# Patient Record
Sex: Female | Born: 1955
Health system: Southern US, Community
[De-identification: ages and names within clinical notes are randomized; demographics above are authoritative.]

## PROBLEM LIST (undated history)

## (undated) DIAGNOSIS — T7840XA Allergy, unspecified, initial encounter: Secondary | ICD-10-CM

## (undated) DIAGNOSIS — M8580 Other specified disorders of bone density and structure, unspecified site: Secondary | ICD-10-CM

## (undated) HISTORY — DX: Allergy, unspecified, initial encounter: T78.40XA

## (undated) HISTORY — PX: COLONOSCOPY: SHX174

## (undated) HISTORY — DX: Other specified disorders of bone density and structure, unspecified site: M85.80

## (undated) HISTORY — PX: NO PAST SURGERIES: SHX2092

---

## 2000-03-19 ENCOUNTER — Encounter: Payer: Self-pay | Admitting: Family Medicine

## 2000-03-19 ENCOUNTER — Encounter: Admission: RE | Admit: 2000-03-19 | Discharge: 2000-03-19 | Payer: Self-pay | Admitting: Family Medicine

## 2002-08-29 ENCOUNTER — Encounter: Payer: Self-pay | Admitting: Family Medicine

## 2002-08-29 ENCOUNTER — Encounter: Admission: RE | Admit: 2002-08-29 | Discharge: 2002-08-29 | Payer: Self-pay | Admitting: Family Medicine

## 2004-05-19 ENCOUNTER — Encounter: Admission: RE | Admit: 2004-05-19 | Discharge: 2004-05-19 | Payer: Self-pay | Admitting: Internal Medicine

## 2009-04-19 ENCOUNTER — Emergency Department (HOSPITAL_COMMUNITY): Admission: EM | Admit: 2009-04-19 | Discharge: 2009-04-19 | Payer: Self-pay | Admitting: Emergency Medicine

## 2010-10-04 ENCOUNTER — Encounter: Payer: Self-pay | Admitting: Internal Medicine

## 2010-11-08 ENCOUNTER — Ambulatory Visit (AMBULATORY_SURGERY_CENTER): Payer: 59 | Admitting: *Deleted

## 2010-11-08 VITALS — Ht 63.0 in | Wt 133.0 lb

## 2010-11-08 DIAGNOSIS — Z1211 Encounter for screening for malignant neoplasm of colon: Secondary | ICD-10-CM

## 2010-11-08 MED ORDER — PEG-KCL-NACL-NASULF-NA ASC-C 100 G PO SOLR: ORAL | Status: DC

## 2010-11-21 ENCOUNTER — Telehealth: Payer: Self-pay | Admitting: Internal Medicine

## 2010-11-21 NOTE — Telephone Encounter (Signed)
Returned pts phone call.  She wanted to know if she could start her prep at 7 after she gets home from work.  Advised her that it would be fine and that she should just push everything back 2 hours.  Pt verbalized understanding.

## 2010-11-22 ENCOUNTER — Ambulatory Visit (AMBULATORY_SURGERY_CENTER): Payer: 59 | Admitting: Internal Medicine

## 2010-11-22 ENCOUNTER — Encounter: Payer: Self-pay | Admitting: Internal Medicine

## 2010-11-22 VITALS — BP 115/70 | HR 66 | Temp 97.3°F | Resp 23 | Ht 63.6 in | Wt 133.0 lb

## 2010-11-22 DIAGNOSIS — D126 Benign neoplasm of colon, unspecified: Secondary | ICD-10-CM

## 2010-11-22 DIAGNOSIS — K5730 Diverticulosis of large intestine without perforation or abscess without bleeding: Secondary | ICD-10-CM

## 2010-11-22 DIAGNOSIS — Z1211 Encounter for screening for malignant neoplasm of colon: Secondary | ICD-10-CM

## 2010-11-22 MED ORDER — SODIUM CHLORIDE 0.9 % IV SOLN: 500.0000 mL | INTRAVENOUS | Status: DC

## 2010-11-22 NOTE — Patient Instructions (Addendum)
Two tiny polyps (2-3 mm in size) were removed. These look benign. You also have diverticulosis. I will send a letter about the polyp pathology and when you should have a routine repeat colonoscopy. Iva Boop, MD, [facility] Information given on polyps, diverticulosis and high fiber diet. Resume all medications.

## 2010-11-22 NOTE — Progress Notes (Signed)
Pt was uncomfortable while the scope was being advanced.  Medication was titrated per Dr. Marvell Fuller orders.  Pt did relax once the cecum was reached and rested comfortably on the way out with her eyes closed. maw

## 2010-11-23 ENCOUNTER — Telehealth: Payer: Self-pay | Admitting: *Deleted

## 2010-11-23 NOTE — Telephone Encounter (Signed)
Follow up Call- Patient questions:Pt was at work, spoke with her husband  Do you have a fever, pain , or abdominal swelling? no Pain Score  0 *  Have you tolerated food without any problems? yes  Have you been able to return to your normal activities? yes  Do you have any questions about your discharge instructions: Diet   no Medications  no Follow up visit  no  Do you have questions or concerns about your Care? no  Actions: * If pain score is 4 or above: No action needed, pain <4.

## 2010-11-29 ENCOUNTER — Encounter: Payer: Self-pay | Admitting: Internal Medicine

## 2010-11-29 NOTE — Progress Notes (Signed)
Quick Note:  Repeat colonoscopy 11/2020 - no adenomas ______

## 2012-04-06 ENCOUNTER — Other Ambulatory Visit: Payer: Self-pay

## 2012-09-24 ENCOUNTER — Other Ambulatory Visit (HOSPITAL_COMMUNITY): Payer: Self-pay | Admitting: Internal Medicine

## 2012-09-24 DIAGNOSIS — Z139 Encounter for screening, unspecified: Secondary | ICD-10-CM

## 2012-09-26 ENCOUNTER — Ambulatory Visit (HOSPITAL_COMMUNITY)
Admission: RE | Admit: 2012-09-26 | Discharge: 2012-09-26 | Disposition: A | Discharge status: Home / Self Care | Payer: 59 | Source: Ambulatory Visit | Attending: Internal Medicine | Admitting: Internal Medicine

## 2012-09-26 DIAGNOSIS — Z1231 Encounter for screening mammogram for malignant neoplasm of breast: Secondary | ICD-10-CM | POA: Insufficient documentation

## 2012-09-26 DIAGNOSIS — Z139 Encounter for screening, unspecified: Secondary | ICD-10-CM

## 2012-10-07 ENCOUNTER — Other Ambulatory Visit: Payer: Self-pay | Admitting: Internal Medicine

## 2012-10-07 DIAGNOSIS — R928 Other abnormal and inconclusive findings on diagnostic imaging of breast: Secondary | ICD-10-CM

## 2012-10-23 ENCOUNTER — Ambulatory Visit (HOSPITAL_COMMUNITY)
Admission: RE | Admit: 2012-10-23 | Discharge: 2012-10-23 | Disposition: A | Discharge status: Home / Self Care | Payer: 59 | Source: Ambulatory Visit | Attending: Internal Medicine | Admitting: Internal Medicine

## 2012-10-23 DIAGNOSIS — R928 Other abnormal and inconclusive findings on diagnostic imaging of breast: Secondary | ICD-10-CM | POA: Insufficient documentation

## 2012-12-26 ENCOUNTER — Other Ambulatory Visit: Payer: Self-pay

## 2013-02-21 ENCOUNTER — Encounter (HOSPITAL_COMMUNITY): Payer: Self-pay | Admitting: Emergency Medicine

## 2013-02-21 ENCOUNTER — Emergency Department (HOSPITAL_COMMUNITY)
Admission: EM | Admit: 2013-02-21 | Discharge: 2013-02-21 | Disposition: A | Discharge status: Home / Self Care | Payer: 59 | Attending: Emergency Medicine | Admitting: Emergency Medicine

## 2013-02-21 DIAGNOSIS — Z791 Long term (current) use of non-steroidal anti-inflammatories (NSAID): Secondary | ICD-10-CM | POA: Insufficient documentation

## 2013-02-21 DIAGNOSIS — J3489 Other specified disorders of nose and nasal sinuses: Secondary | ICD-10-CM | POA: Insufficient documentation

## 2013-02-21 DIAGNOSIS — J029 Acute pharyngitis, unspecified: Secondary | ICD-10-CM | POA: Insufficient documentation

## 2013-02-21 DIAGNOSIS — R509 Fever, unspecified: Secondary | ICD-10-CM | POA: Insufficient documentation

## 2013-02-21 LAB — RAPID STREP SCREEN (MED CTR MEBANE ONLY): STREPTOCOCCUS, GROUP A SCREEN (DIRECT): NEGATIVE

## 2013-02-21 MED ORDER — NAPROXEN 500 MG PO TABS: 500.0000 mg | ORAL_TABLET | Freq: Two times a day (BID) | ORAL | Status: DC

## 2013-02-21 NOTE — ED Provider Notes (Signed)
CSN: [id number]     Arrival date & time 02/21/13  7793 History   First MD Initiated Contact with Patient 02/21/13 [id number]     Chief Complaint  Patient presents with  . Sore Throat   (Consider location/radiation/quality/duration/timing/severity/associated sxs/prior Treatment) HPI Comments: 58 year old female, history of no significant past medical problems who presents with a sore throat that has been ongoing for several days, reports intermittent low-grade fever and nasal congestion. The throat is persistent, gradually getting worse, not associated with the change in voice, there is no significant cough, no abdominal pain, no nausea or vomiting. She did have dental work done in the last week but denies swelling in her mouth or dental pain  Patient is a 58 y.o. female presenting with pharyngitis. The history is provided by the patient.  Sore Throat    History reviewed. No pertinent past medical history. Past Surgical History  Procedure Laterality Date  . Colonoscopy  11/22/2010    diverticulosis (no adenomas)   No family history on file. History  Substance Use Topics  . Smoking status: Never Smoker   . Smokeless tobacco: Never Used  . Alcohol Use: No   OB History   Grav Para Term Preterm Abortions TAB SAB Ect Mult Living                 Review of Systems  All other systems reviewed and are negative.    Allergies  Review of patient's allergies indicates no known allergies.  Home Medications   Current Outpatient Rx  Name  Route  Sig  Dispense  Refill  . naproxen (NAPROSYN) 500 MG tablet   Oral   Take 1 tablet (500 mg total) by mouth 2 (two) times daily with a meal.   30 tablet   0    BP 131/87  Pulse 70  Resp 18  Ht 5\' 3"  (1.6 m)  Wt 128 lb (58.06 kg)  BMI 22.68 kg/m2  SpO2 99% Physical Exam  Nursing note and vitals reviewed. Constitutional: She appears well-developed and well-nourished. No distress.  HENT:  Head: Normocephalic and atraumatic.  Mouth/Throat:  Oropharynx is clear and moist. No oropharyngeal exudate.  Oropharynx is clear and moist, normal appearing tonsils with no exudate asymmetry hypertrophy or erythema, normal mucous membranes are moist, tympanic membranes are clear, nasal passages are clear with mild rhinorrhea  Eyes: Conjunctivae and EOM are normal. Pupils are equal, round, and reactive to light. Right eye exhibits no discharge. Left eye exhibits no discharge. No scleral icterus.  Neck: Normal range of motion. Neck supple. No JVD present. No thyromegaly present.  Cardiovascular: Normal rate, regular rhythm, normal heart sounds and intact distal pulses.  Exam reveals no gallop and no friction rub.   No murmur heard. Pulmonary/Chest: Effort normal and breath sounds normal. No respiratory distress. She has no wheezes. She has no rales.  Abdominal: Soft. Bowel sounds are normal. She exhibits no distension and no mass. There is no tenderness.  No hepatosplenomegaly  Lymphadenopathy:    She has cervical adenopathy ( Shotty tender left anterior cervical chain lymphadenopathy).  Neurological: She is alert. Coordination normal.  Skin: Skin is warm and dry. No rash noted. No erythema.  Psychiatric: She has a normal mood and affect. Her behavior is normal.    ED Course  Procedures (including critical care time) Labs Review Labs Reviewed  RAPID STREP SCREEN  CULTURE, GROUP A STREP   Imaging Review No results found.  EKG Interpretation   None  MDM   1. Pharyngitis    The patient has a pharyngitis, she does not have a fever, she does not have tachycardia or a hypoxia, evaluate for strep no signs of dental complications.  Strep neg - stable for d/c.  Meds given in ED:  Medications - No data to display  New Prescriptions   NAPROXEN (NAPROSYN) 500 MG TABLET    Take 1 tablet (500 mg total) by mouth 2 (two) times daily with a meal.      Johnna Acosta, MD 02/21/13 [phone]

## 2013-02-21 NOTE — Discharge Instructions (Signed)
Strep test negative,  Naprosyn for pain,  Tylenol for fever,  You likely have a sore throat from a viral infection - it is not strep - return as needed.  See attached instructions.  Please call your doctor for a followup appointment within 24-48 hours. When you talk to your doctor please let them know that you were seen in the emergency department and have them acquire all of your records so that they can discuss the findings with you and formulate a treatment plan to fully care for your new and ongoing problems.

## 2013-02-21 NOTE — ED Notes (Signed)
Pt c/o sore throat since Monday and intermittent low grade fever.  Reports sore throat is progressively getting worse.

## 2013-02-23 LAB — CULTURE, GROUP A STREP

## 2013-03-11 ENCOUNTER — Ambulatory Visit (INDEPENDENT_AMBULATORY_CARE_PROVIDER_SITE_OTHER): Payer: 59 | Admitting: Nurse Practitioner

## 2013-03-11 ENCOUNTER — Encounter: Payer: Self-pay | Admitting: Nurse Practitioner

## 2013-03-11 VITALS — BP 102/60 | HR 64 | Ht 63.0 in | Wt 127.0 lb

## 2013-03-11 DIAGNOSIS — Z01419 Encounter for gynecological examination (general) (routine) without abnormal findings: Secondary | ICD-10-CM

## 2013-03-11 DIAGNOSIS — Z0000 Encounter for general adult medical examination without abnormal findings: Secondary | ICD-10-CM

## 2013-03-11 MED ORDER — MEDROXYPROGESTERONE ACETATE 10 MG PO TABS: 10.0000 mg | ORAL_TABLET | Freq: Every day | ORAL | Status: DC

## 2013-03-11 NOTE — Patient Instructions (Signed)

## 2013-03-11 NOTE — Progress Notes (Signed)
Patient ID: Stephanie Morrow, female   DOB: 1955-06-23, 58 y.o.   MRN: [id number] 58 y.o. G0P0 Married Caucasian Fe here for NGYN annual exam.  LMP about 6 months ago. Before this last cycle she was very light and irregular for about a year. Since the last menses only one occasion of menstrual symptoms without a cycle.  Some vaso symptoms several months ago but not now.   No vaginal dryness. No change in libido. No other menopausal symptoms.  No LMP recorded. Patient is not currently having periods (Reason: Perimenopausal).          Sexually active: yes  The current method of family planning is post menopausal status.    Exercising: yes  Gym/ health club routine includes cardio and mod to heavy weightlifting.  Pt exercises 30-45 minutes 4-5 days per week at [facility] or [facility]. Smoker:  no  Health Maintenance: Pap:  2+ years, no abnormal pap's MMG:  10/23/12, Bi-Rads 3: probable benign, 6 month recall for right Colonoscopy:  11/22/10, diverticula, repeat in 5 years BMD:   2012  TDaP:  UTD ? 2008 Labs: PCP, Dr. Brigitte Pulse   reports that she has never smoked. She has never used smokeless tobacco. She reports that she does not drink alcohol or use illicit drugs.  History reviewed. No pertinent past medical history.  Past Surgical History  Procedure Laterality Date  . No past surgeries      Current Outpatient Prescriptions  Medication Sig Dispense Refill  . Multiple Vitamin (MULTIVITAMIN) tablet Take 1 tablet by mouth daily.      . Omega 3 1000 MG CAPS Take by mouth.      . medroxyPROGESTERone (PROVERA) 10 MG tablet Take 1 tablet (10 mg total) by mouth daily.  10 tablet  0   No current facility-administered medications for this visit.    Family History  Problem Relation Age of Onset  . Cancer Father   . COPD Father     ROS:  Pertinent items are noted in HPI.  Otherwise, a comprehensive ROS was negative.  Exam:   BP 102/60  Pulse 64  Ht 5\' 3"  (1.6 m)  Wt 127 lb (57.607 kg)  BMI 22.50 kg/m2  Height: 5\' 3"  (160 cm)  Ht Readings from Last 3 Encounters:  03/11/13 5\' 3"  (1.6 m)  02/21/13 5\' 3"  (1.6 m)  11/22/10 5' 3.6" (1.615 m)    General appearance: alert, cooperative and appears stated age Head: Normocephalic, without obvious abnormality, atraumatic Neck: no adenopathy, supple, symmetrical, trachea midline and thyroid normal to inspection and palpation Lungs: clear to auscultation bilaterally Breasts: normal appearance, no masses or tenderness, dense breast. Heart: regular rate and rhythm Abdomen: soft, non-tender; no masses,  no organomegaly Extremities: extremities normal, atraumatic, no cyanosis or edema Skin: Skin color, texture, turgor normal. No rashes or lesions Lymph nodes: Cervical, supraclavicular, and axillary nodes normal. No abnormal inguinal nodes palpated Neurologic: Grossly normal   Pelvic: External genitalia:  no lesions              Urethra:  normal appearing urethra with no masses, tenderness or lesions              Bartholin's and Skene's: normal                 Vagina: normal appearing vagina with normal color and discharge, no lesions              Cervix: anteverted  Pap taken: yes Bimanual Exam:  Uterus:  normal size, contour, position, consistency, mobility, non-tender              Adnexa: no mass, fullness, tenderness               Rectovaginal: Confirms               Anus:  normal sphincter tone, no lesions  A:  Well Woman with normal exam  Perimenopausal with current amenorrhea X 6 months  P:   Pap smear as per guidelines Done today  Mammogram is due first of March - recall of 6 months on the right  Will do Provera challenge and to report any withdrawal bleeding afterwards, if no bleeding to get [facility].  Counseled on breast self exam, mammography screening, adequate intake of calcium and vitamin D, diet and exercise return annually or prn  An After Visit Summary was printed and given to the patient.

## 2013-03-13 LAB — IPS PAP TEST WITH HPV

## 2013-03-14 NOTE — Progress Notes (Signed)
Encounter reviewed by Dr. Brook Silva.  

## 2013-03-21 ENCOUNTER — Other Ambulatory Visit (HOSPITAL_COMMUNITY): Payer: Self-pay | Admitting: Internal Medicine

## 2013-03-21 DIAGNOSIS — R928 Other abnormal and inconclusive findings on diagnostic imaging of breast: Secondary | ICD-10-CM

## 2013-04-04 ENCOUNTER — Telehealth: Payer: Self-pay | Admitting: Nurse Practitioner

## 2013-04-04 DIAGNOSIS — R928 Other abnormal and inconclusive findings on diagnostic imaging of breast: Secondary | ICD-10-CM

## 2013-04-04 NOTE — Telephone Encounter (Signed)
Call to patient, female states patient wanted to let us know she did not have any bleeding.Per  Note from PG at [facility], if no bleeding, needs [facility]SH.  Advised to have patient call to discuss this.

## 2013-04-04 NOTE — Telephone Encounter (Signed)
Spoke with patient. She finished two weeks of Provera with no withdrawal bleeding. Per Milford Cage, FNP to have [facility] drawn.  Ordered and released.  Patient would like to have drawn at [facility].

## 2013-04-04 NOTE — Telephone Encounter (Signed)
Pt calling to give a update on her medication.

## 2013-04-07 NOTE — Telephone Encounter (Signed)
Stephanie Morrow patient wants to have [place] drawn at [facility]. How long after she finishes progesterone do you want her to have [place] drawn?

## 2013-04-07 NOTE — Telephone Encounter (Signed)
She can have [place] drawn now - I usually give them 2 weeks after last Provera tablet to see if they bleed. Then get an [facility] at about a month out.  Looks like she is about there now. [place] for order to [employer] [facility].

## 2013-04-08 NOTE — Telephone Encounter (Signed)
Message left with Jeneen Rinks (OK per ROI) that OK to have labs drawn now and order has been faxed to [facility].  He will relay message to patient. Order faxed to [phone]. Order also in [place].

## 2013-04-09 ENCOUNTER — Other Ambulatory Visit: Payer: Self-pay | Admitting: Nurse Practitioner

## 2013-04-09 LAB — FOLLICLE STIMULATING HORMONE: FSH: 37.2 m[IU]/mL

## 2013-05-14 ENCOUNTER — Encounter (HOSPITAL_COMMUNITY): Payer: 59

## 2013-05-21 ENCOUNTER — Encounter (HOSPITAL_COMMUNITY): Payer: 59

## 2013-05-28 ENCOUNTER — Ambulatory Visit (HOSPITAL_COMMUNITY)
Admission: RE | Admit: 2013-05-28 | Discharge: 2013-05-28 | Disposition: A | Discharge status: Home / Self Care | Payer: 59 | Source: Ambulatory Visit | Attending: Internal Medicine | Admitting: Internal Medicine

## 2013-05-28 DIAGNOSIS — Z09 Encounter for follow-up examination after completed treatment for conditions other than malignant neoplasm: Secondary | ICD-10-CM | POA: Insufficient documentation

## 2013-05-28 DIAGNOSIS — R928 Other abnormal and inconclusive findings on diagnostic imaging of breast: Secondary | ICD-10-CM | POA: Insufficient documentation

## 2014-03-19 ENCOUNTER — Ambulatory Visit: Payer: 59 | Admitting: Nurse Practitioner

## 2014-09-28 ENCOUNTER — Other Ambulatory Visit (HOSPITAL_COMMUNITY): Payer: Self-pay | Admitting: Internal Medicine

## 2014-09-28 DIAGNOSIS — Z09 Encounter for follow-up examination after completed treatment for conditions other than malignant neoplasm: Secondary | ICD-10-CM

## 2014-09-28 DIAGNOSIS — R921 Mammographic calcification found on diagnostic imaging of breast: Secondary | ICD-10-CM

## 2014-10-06 ENCOUNTER — Ambulatory Visit (HOSPITAL_COMMUNITY)
Admission: RE | Admit: 2014-10-06 | Discharge: 2014-10-06 | Disposition: A | Discharge status: Home / Self Care | Payer: 59 | Source: Ambulatory Visit | Attending: Internal Medicine | Admitting: Internal Medicine

## 2014-10-06 DIAGNOSIS — Z09 Encounter for follow-up examination after completed treatment for conditions other than malignant neoplasm: Secondary | ICD-10-CM

## 2014-10-06 DIAGNOSIS — R921 Mammographic calcification found on diagnostic imaging of breast: Secondary | ICD-10-CM | POA: Insufficient documentation

## 2015-05-14 DIAGNOSIS — Z0000 Encounter for general adult medical examination without abnormal findings: Secondary | ICD-10-CM | POA: Diagnosis not present

## 2015-05-21 DIAGNOSIS — Z6825 Body mass index (BMI) 25.0-25.9, adult: Secondary | ICD-10-CM | POA: Diagnosis not present

## 2015-05-21 DIAGNOSIS — Z0000 Encounter for general adult medical examination without abnormal findings: Secondary | ICD-10-CM | POA: Diagnosis not present

## 2015-05-21 DIAGNOSIS — Z1389 Encounter for screening for other disorder: Secondary | ICD-10-CM | POA: Diagnosis not present

## 2015-05-21 DIAGNOSIS — Z780 Asymptomatic menopausal state: Secondary | ICD-10-CM | POA: Diagnosis not present

## 2015-09-07 DIAGNOSIS — M810 Age-related osteoporosis without current pathological fracture: Secondary | ICD-10-CM | POA: Diagnosis not present

## 2015-09-07 DIAGNOSIS — Z6825 Body mass index (BMI) 25.0-25.9, adult: Secondary | ICD-10-CM | POA: Diagnosis not present

## 2015-09-07 MED FILL — ALENDRONATE NA 70 MG TAB: 70 | 84 days supply | Qty: 12 | Fill #0

## 2015-10-15 ENCOUNTER — Other Ambulatory Visit (HOSPITAL_COMMUNITY): Payer: Self-pay | Admitting: Internal Medicine

## 2015-10-15 DIAGNOSIS — Z1231 Encounter for screening mammogram for malignant neoplasm of breast: Secondary | ICD-10-CM

## 2015-10-20 ENCOUNTER — Ambulatory Visit (HOSPITAL_COMMUNITY)
Admission: RE | Admit: 2015-10-20 | Discharge: 2015-10-20 | Disposition: A | Discharge status: Home / Self Care | Payer: 59 | Source: Ambulatory Visit | Attending: Internal Medicine | Admitting: Internal Medicine

## 2015-10-20 DIAGNOSIS — Z1231 Encounter for screening mammogram for malignant neoplasm of breast: Secondary | ICD-10-CM | POA: Diagnosis not present

## 2015-12-16 MED FILL — ALENDRONATE NA 70 MG TAB: 70 | 84 days supply | Qty: 12 | Fill #1

## 2016-04-17 MED FILL — ALENDRONATE NA 70 MG TAB: 70 | 84 days supply | Qty: 12 | Fill #2

## 2016-09-18 ENCOUNTER — Other Ambulatory Visit (HOSPITAL_COMMUNITY): Payer: Self-pay | Admitting: Internal Medicine

## 2016-09-18 DIAGNOSIS — Z1231 Encounter for screening mammogram for malignant neoplasm of breast: Secondary | ICD-10-CM

## 2016-09-28 MED FILL — ALENDRONATE NA 70 MG TAB: 70 | 84 days supply | Qty: 12 | Fill #0

## 2016-10-17 DIAGNOSIS — H524 Presbyopia: Secondary | ICD-10-CM | POA: Diagnosis not present

## 2016-10-25 ENCOUNTER — Encounter (HOSPITAL_COMMUNITY): Payer: Self-pay | Admitting: Radiology

## 2016-10-25 ENCOUNTER — Ambulatory Visit (HOSPITAL_COMMUNITY)
Admission: RE | Admit: 2016-10-25 | Discharge: 2016-10-25 | Disposition: A | Discharge status: Home / Self Care | Payer: 59 | Source: Ambulatory Visit | Attending: Internal Medicine | Admitting: Internal Medicine

## 2016-10-25 DIAGNOSIS — Z1231 Encounter for screening mammogram for malignant neoplasm of breast: Secondary | ICD-10-CM | POA: Insufficient documentation

## 2017-01-16 DIAGNOSIS — Z0000 Encounter for general adult medical examination without abnormal findings: Secondary | ICD-10-CM | POA: Diagnosis not present

## 2017-01-16 DIAGNOSIS — M810 Age-related osteoporosis without current pathological fracture: Secondary | ICD-10-CM | POA: Diagnosis not present

## 2017-01-18 DIAGNOSIS — D1801 Hemangioma of skin and subcutaneous tissue: Secondary | ICD-10-CM | POA: Diagnosis not present

## 2017-01-18 DIAGNOSIS — L814 Other melanin hyperpigmentation: Secondary | ICD-10-CM | POA: Diagnosis not present

## 2017-01-18 DIAGNOSIS — L918 Other hypertrophic disorders of the skin: Secondary | ICD-10-CM | POA: Diagnosis not present

## 2017-01-18 DIAGNOSIS — D2271 Melanocytic nevi of right lower limb, including hip: Secondary | ICD-10-CM | POA: Diagnosis not present

## 2017-01-18 DIAGNOSIS — L821 Other seborrheic keratosis: Secondary | ICD-10-CM | POA: Diagnosis not present

## 2017-01-18 DIAGNOSIS — L720 Epidermal cyst: Secondary | ICD-10-CM | POA: Diagnosis not present

## 2017-01-23 DIAGNOSIS — Z0000 Encounter for general adult medical examination without abnormal findings: Secondary | ICD-10-CM | POA: Diagnosis not present

## 2017-01-23 DIAGNOSIS — Z1389 Encounter for screening for other disorder: Secondary | ICD-10-CM | POA: Diagnosis not present

## 2017-01-23 DIAGNOSIS — M810 Age-related osteoporosis without current pathological fracture: Secondary | ICD-10-CM | POA: Diagnosis not present

## 2017-01-26 DIAGNOSIS — Z1212 Encounter for screening for malignant neoplasm of rectum: Secondary | ICD-10-CM | POA: Diagnosis not present

## 2017-02-14 MED FILL — ALENDRONATE NA 70 MG TAB: 70 | 84 days supply | Qty: 12 | Fill #1

## 2017-07-11 MED FILL — ALENDRONATE NA 70 MG TAB: 70 | 84 days supply | Qty: 12 | Fill #2

## 2017-09-26 ENCOUNTER — Other Ambulatory Visit (HOSPITAL_COMMUNITY): Payer: Self-pay | Admitting: Internal Medicine

## 2017-09-26 DIAGNOSIS — Z1231 Encounter for screening mammogram for malignant neoplasm of breast: Secondary | ICD-10-CM

## 2017-11-07 ENCOUNTER — Ambulatory Visit (HOSPITAL_COMMUNITY)
Admission: RE | Admit: 2017-11-07 | Discharge: 2017-11-07 | Disposition: A | Discharge status: Home / Self Care | Payer: 59 | Source: Ambulatory Visit | Attending: Internal Medicine | Admitting: Internal Medicine

## 2017-11-07 DIAGNOSIS — Z1231 Encounter for screening mammogram for malignant neoplasm of breast: Secondary | ICD-10-CM | POA: Insufficient documentation

## 2017-11-08 MED FILL — ALENDRONATE NA 70 MG TAB: 70 | 84 days supply | Qty: 12 | Fill #0

## 2018-01-22 DIAGNOSIS — R82998 Other abnormal findings in urine: Secondary | ICD-10-CM | POA: Diagnosis not present

## 2018-01-22 DIAGNOSIS — M810 Age-related osteoporosis without current pathological fracture: Secondary | ICD-10-CM | POA: Diagnosis not present

## 2018-01-22 DIAGNOSIS — Z0000 Encounter for general adult medical examination without abnormal findings: Secondary | ICD-10-CM | POA: Diagnosis not present

## 2018-01-30 DIAGNOSIS — M545 Low back pain: Secondary | ICD-10-CM | POA: Diagnosis not present

## 2018-01-30 DIAGNOSIS — M810 Age-related osteoporosis without current pathological fracture: Secondary | ICD-10-CM | POA: Diagnosis not present

## 2018-01-30 DIAGNOSIS — Z6824 Body mass index (BMI) 24.0-24.9, adult: Secondary | ICD-10-CM | POA: Diagnosis not present

## 2018-01-30 DIAGNOSIS — Z1389 Encounter for screening for other disorder: Secondary | ICD-10-CM | POA: Diagnosis not present

## 2018-01-30 DIAGNOSIS — Z0000 Encounter for general adult medical examination without abnormal findings: Secondary | ICD-10-CM | POA: Diagnosis not present

## 2018-02-06 ENCOUNTER — Encounter: Payer: Self-pay | Admitting: Certified Nurse Midwife

## 2018-02-18 DIAGNOSIS — Z1212 Encounter for screening for malignant neoplasm of rectum: Secondary | ICD-10-CM | POA: Diagnosis not present

## 2018-02-22 ENCOUNTER — Other Ambulatory Visit: Payer: Self-pay

## 2018-02-22 ENCOUNTER — Other Ambulatory Visit (HOSPITAL_COMMUNITY)
Admission: RE | Admit: 2018-02-22 | Discharge: 2018-02-22 | Disposition: A | Discharge status: Home / Self Care | Payer: 59 | Source: Ambulatory Visit | Attending: Certified Nurse Midwife | Admitting: Certified Nurse Midwife

## 2018-02-22 ENCOUNTER — Encounter: Payer: Self-pay | Admitting: Certified Nurse Midwife

## 2018-02-22 ENCOUNTER — Ambulatory Visit (INDEPENDENT_AMBULATORY_CARE_PROVIDER_SITE_OTHER): Payer: 59 | Admitting: Certified Nurse Midwife

## 2018-02-22 VITALS — BP 104/64 | HR 68 | Resp 16 | Ht 62.25 in | Wt 133.0 lb

## 2018-02-22 DIAGNOSIS — Z01419 Encounter for gynecological examination (general) (routine) without abnormal findings: Secondary | ICD-10-CM | POA: Diagnosis not present

## 2018-02-22 DIAGNOSIS — N951 Menopausal and female climacteric states: Secondary | ICD-10-CM | POA: Diagnosis not present

## 2018-02-22 DIAGNOSIS — Z124 Encounter for screening for malignant neoplasm of cervix: Secondary | ICD-10-CM | POA: Insufficient documentation

## 2018-02-22 DIAGNOSIS — Z8739 Personal history of other diseases of the musculoskeletal system and connective tissue: Secondary | ICD-10-CM

## 2018-02-22 NOTE — Patient Instructions (Signed)

## 2018-02-22 NOTE — Progress Notes (Signed)
63 y.o. G0P0 Married  Caucasian Fe here to re- establish gyn care for annual exam. Periods stopped menses at age 73. Denies vaginal bleeding or vaginal dryness. Sees PCP Dr. Brigitte Pulse annually, labs and Osteoporosis management. Taking Fosamax with out issues. Runs for exercise. Has not had pap smear since 2015. No history of abnormal pap. Works as Engineer, maintenance (IT) at [employer]. No other health issues today.  Patient's last menstrual period was 09/20/2010.          Sexually active: Yes.    The current method of family planning is post menopausal status.    Exercising: Yes.    running Smoker:  no  Review of Systems  Constitutional: Negative.   HENT: Negative.   Eyes: Negative.   Respiratory: Negative.   Cardiovascular: Negative.   Gastrointestinal: Negative.   Genitourinary: Negative.   Musculoskeletal: Negative.   Skin: Negative.   Neurological: Negative.   Endo/Heme/Allergies: Negative.   Psychiatric/Behavioral: Negative.     Health Maintenance: Pap:  03-11-13 neg HPV HR neg History of Abnormal Pap: no MMG:  11-07-17 category d density birads 1:neg Self Breast exams: occ Colonoscopy:  2015 f/u 47yrs BMD:   12/19 osteopenia TDaP:  UTD Shingles: not done Pneumonia: not done Hep C and HIV: not done Labs: with PCP   reports that she has quit smoking. She has never used smokeless tobacco. She reports that she does not drink alcohol or use drugs.  History reviewed. No pertinent past medical history.  Past Surgical History:  Procedure Laterality Date  . NO PAST SURGERIES      Current Outpatient Medications  Medication Sig Dispense Refill  . Alendronate Sodium (FOSAMAX PO) Take by mouth.    . Multiple Vitamin (MULTIVITAMIN) tablet Take 1 tablet by mouth.     Marland Kitchen VITAMIN D PO Take 5,000 Int'l Units by mouth. 5 times a week     No current facility-administered medications for this visit.     Family History  Problem Relation Age of Onset  . Breast cancer Mother   . Hypertension  Mother   . Kidney disease Mother   . Stroke Mother        mini stroke  . COPD Father   . Lung cancer Father        smoker  . Breast cancer Maternal Aunt   . Breast cancer Maternal Aunt     ROS:  Pertinent items are noted in HPI.  Otherwise, a comprehensive ROS was negative.  Exam:   BP 104/64   Pulse 68   Resp 16   Ht 5' 2.25" (1.581 m)   Wt 133 lb (60.3 kg)   LMP 09/20/2010   BMI 24.13 kg/m  Height: 5' 2.25" (158.1 cm) Ht Readings from Last 3 Encounters:  02/22/18 5' 2.25" (1.581 m)  03/11/13 5\' 3"  (1.6 m)  02/21/13 5\' 3"  (1.6 m)    General appearance: alert, cooperative and appears stated age Head: Normocephalic, without obvious abnormality, atraumatic Neck: no adenopathy, supple, symmetrical, trachea midline and thyroid normal to inspection and palpation Lungs: clear to auscultation bilaterally Breasts: normal appearance, no masses or tenderness, No nipple retraction or dimpling, No nipple discharge or bleeding, No axillary or supraclavicular adenopathy Heart: regular rate and rhythm Abdomen: soft, non-tender; no masses,  no organomegaly Extremities: extremities normal, atraumatic, no cyanosis or edema Skin: Skin color, texture, turgor normal. No rashes or lesions Lymph nodes: Cervical, supraclavicular, and axillary nodes normal. No abnormal inguinal nodes palpated Neurologic: Grossly normal   Pelvic: External  genitalia:  no lesions              Urethra:  normal appearing urethra with no masses, tenderness or lesions              Bartholin's and Skene's: normal                 Vagina: normal appearing vagina with normal color and discharge, no lesions              Cervix: no cervical motion tenderness, no lesions and normal appearance              Pap taken: Yes.   Bimanual Exam:  Uterus:  normal size, contour, position, consistency, mobility, non-tender and anteverted              Adnexa: normal adnexa and no mass, fullness, tenderness               Rectovaginal:  Confirms               Anus:  normal sphincter tone, no lesions  Chaperone present: yes  A:  Well Woman with normal exam  Menopausal no HRT  Vaginal dryness  Osteoporosis with PCP management, BMD due, taking Fosamax  P:   Reviewed health and wellness pertinent to exam  Discussed importance of notifying if vaginal bleeding.  Discussed OTC products or estrogen use for dryness. Will try OTC use and advise if no change or problems.  Continue follow up with PCP as indicated and BMD follow up. Discussed calcium sources available.  Pap smear: yes   counseled on breast self exam, mammography screening, feminine hygiene, menopause, adequate intake of calcium and vitamin D, diet and exercise, Kegel's exercises  return annually or prn  An After Visit Summary was printed and given to the patient.

## 2018-02-26 LAB — CYTOLOGY - PAP
DIAGNOSIS: NEGATIVE
HPV: NOT DETECTED

## 2018-04-10 MED FILL — ALENDRONATE NA 70 MG TAB: 70 | 84 days supply | Qty: 12 | Fill #1

## 2018-09-24 DIAGNOSIS — Z20828 Contact with and (suspected) exposure to other viral communicable diseases: Secondary | ICD-10-CM | POA: Diagnosis not present

## 2018-10-07 ENCOUNTER — Other Ambulatory Visit (HOSPITAL_COMMUNITY): Payer: Self-pay | Admitting: Internal Medicine

## 2018-10-07 DIAGNOSIS — Z1231 Encounter for screening mammogram for malignant neoplasm of breast: Secondary | ICD-10-CM

## 2018-10-18 DIAGNOSIS — H524 Presbyopia: Secondary | ICD-10-CM | POA: Diagnosis not present

## 2018-10-18 DIAGNOSIS — H5203 Hypermetropia, bilateral: Secondary | ICD-10-CM | POA: Diagnosis not present

## 2018-11-11 ENCOUNTER — Other Ambulatory Visit: Payer: Self-pay

## 2018-11-11 ENCOUNTER — Ambulatory Visit (HOSPITAL_COMMUNITY)
Admission: RE | Admit: 2018-11-11 | Discharge: 2018-11-11 | Disposition: A | Discharge status: Home / Self Care | Payer: 59 | Source: Ambulatory Visit | Attending: Internal Medicine | Admitting: Internal Medicine

## 2018-11-11 DIAGNOSIS — Z1231 Encounter for screening mammogram for malignant neoplasm of breast: Secondary | ICD-10-CM | POA: Diagnosis not present

## 2018-11-12 ENCOUNTER — Other Ambulatory Visit (HOSPITAL_COMMUNITY): Payer: Self-pay | Admitting: Internal Medicine

## 2018-11-14 ENCOUNTER — Other Ambulatory Visit (HOSPITAL_COMMUNITY): Payer: Self-pay | Admitting: Internal Medicine

## 2018-11-14 DIAGNOSIS — R928 Other abnormal and inconclusive findings on diagnostic imaging of breast: Secondary | ICD-10-CM

## 2018-11-21 MED FILL — ALENDRONATE NA 70 MG TAB: 70 | 84 days supply | Qty: 12 | Fill #0

## 2018-11-26 ENCOUNTER — Ambulatory Visit (HOSPITAL_COMMUNITY): Admission: RE | Admit: 2018-11-26 | Payer: 59 | Source: Ambulatory Visit

## 2018-11-26 ENCOUNTER — Other Ambulatory Visit: Payer: Self-pay

## 2018-11-26 ENCOUNTER — Ambulatory Visit (HOSPITAL_COMMUNITY)
Admission: RE | Admit: 2018-11-26 | Discharge: 2018-11-26 | Disposition: A | Discharge status: Home / Self Care | Payer: 59 | Source: Ambulatory Visit | Attending: Internal Medicine | Admitting: Internal Medicine

## 2018-11-26 DIAGNOSIS — R928 Other abnormal and inconclusive findings on diagnostic imaging of breast: Secondary | ICD-10-CM

## 2018-12-02 IMAGING — MG DIGITAL SCREENING BILATERAL MAMMOGRAM WITH TOMO AND CAD
8 series · 9 of 24 positions shown · non-contrast
Comparison: Previous exam(s).

CLINICAL DATA: Screening.

EXAM:
DIGITAL SCREENING BILATERAL MAMMOGRAM WITH TOMO AND CAD

[R MLO synth-2D]
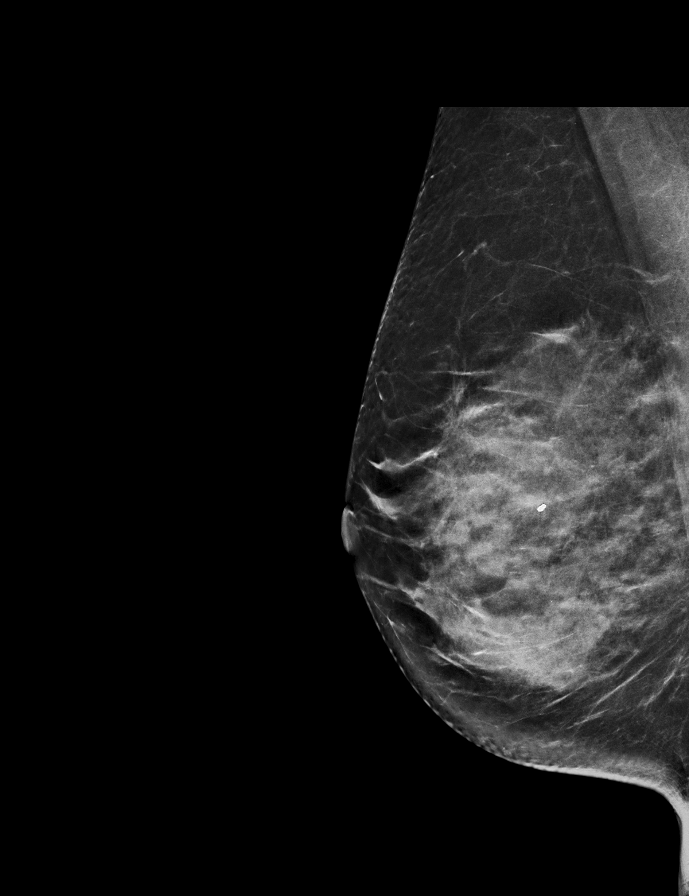

[L MLO synth-2D]
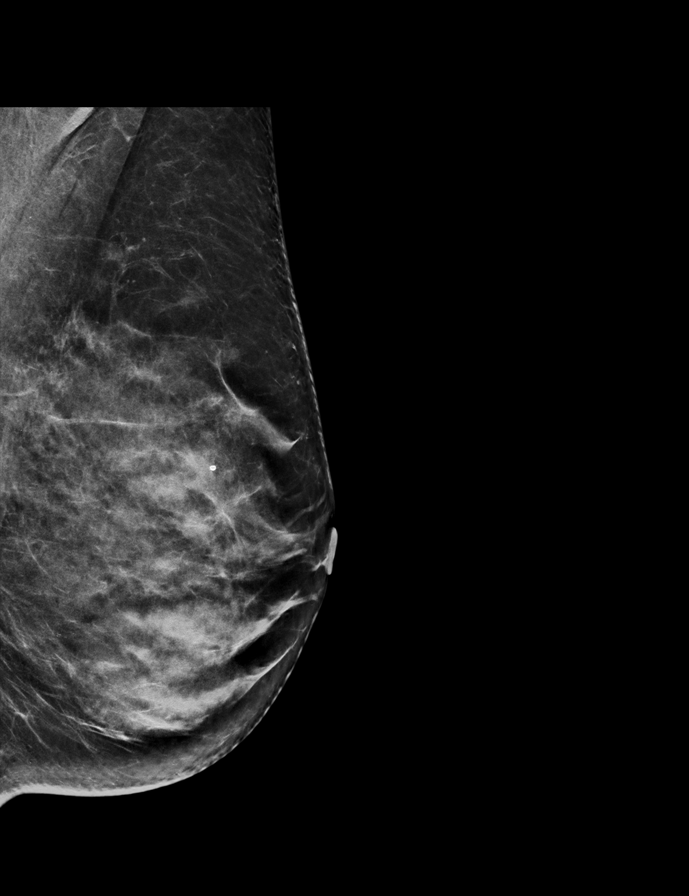

[R CC synth-2D]
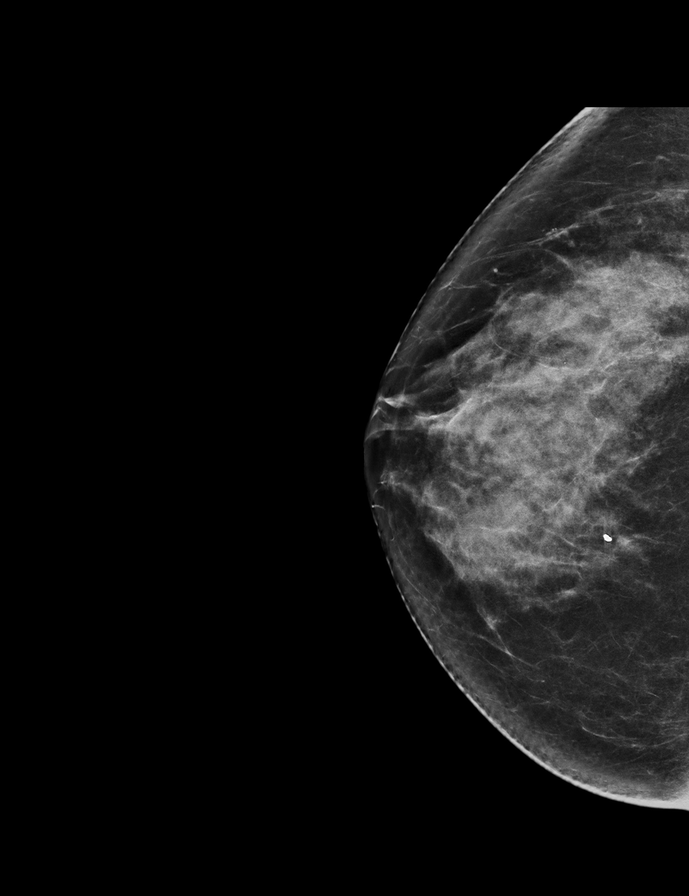

[L CC synth-2D]
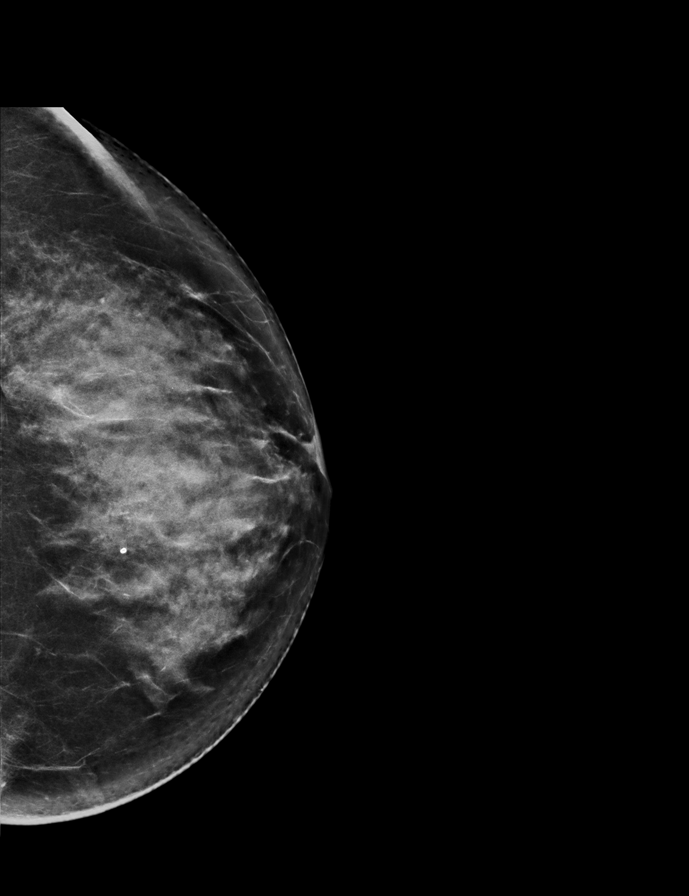

[L MLO tomo · 2 of 69 frames shown]
[frame 23/69]
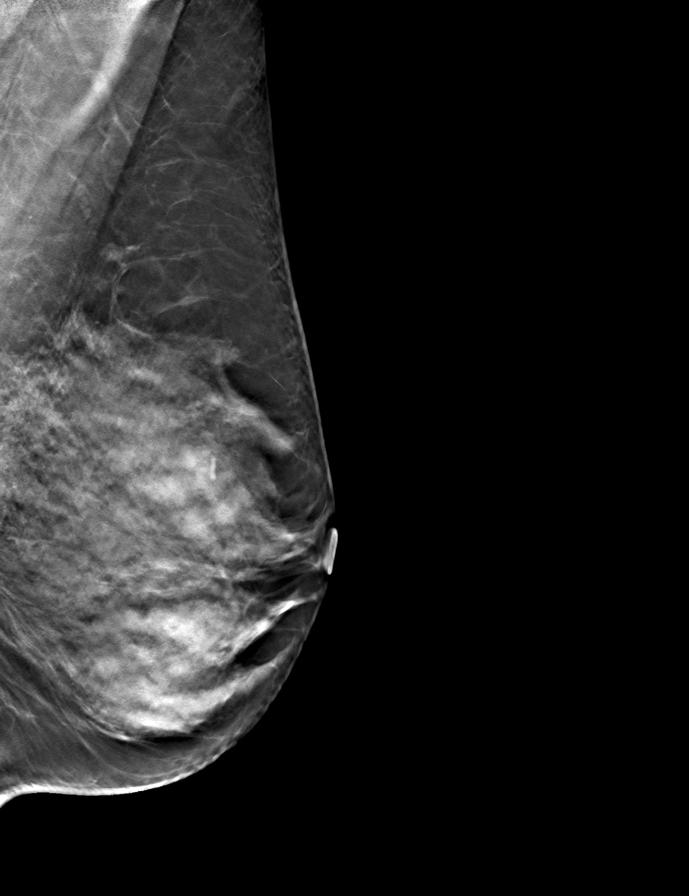
[frame 35/69]
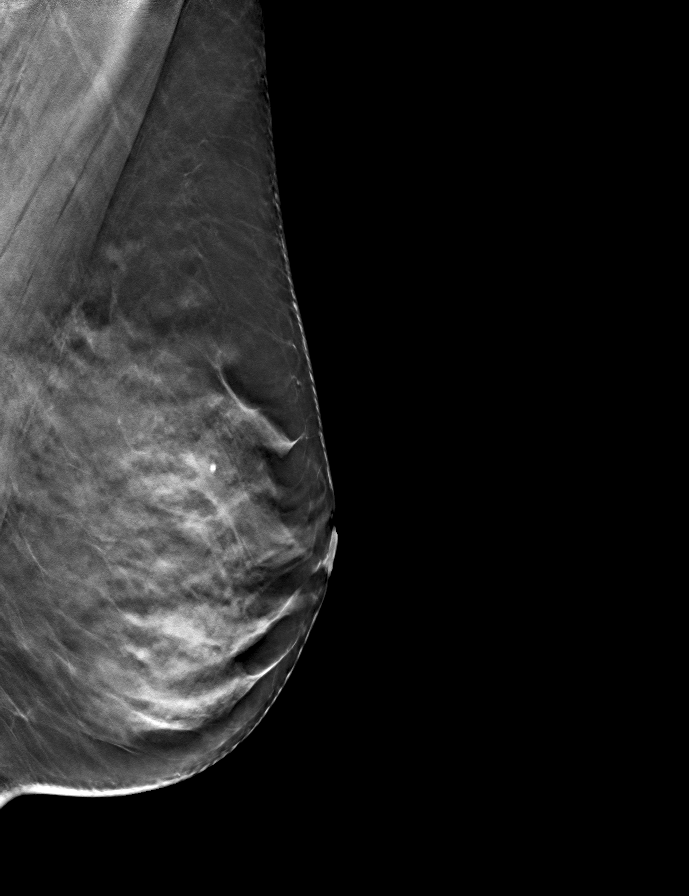

[R CC tomo · tomo slice 37/72.0]
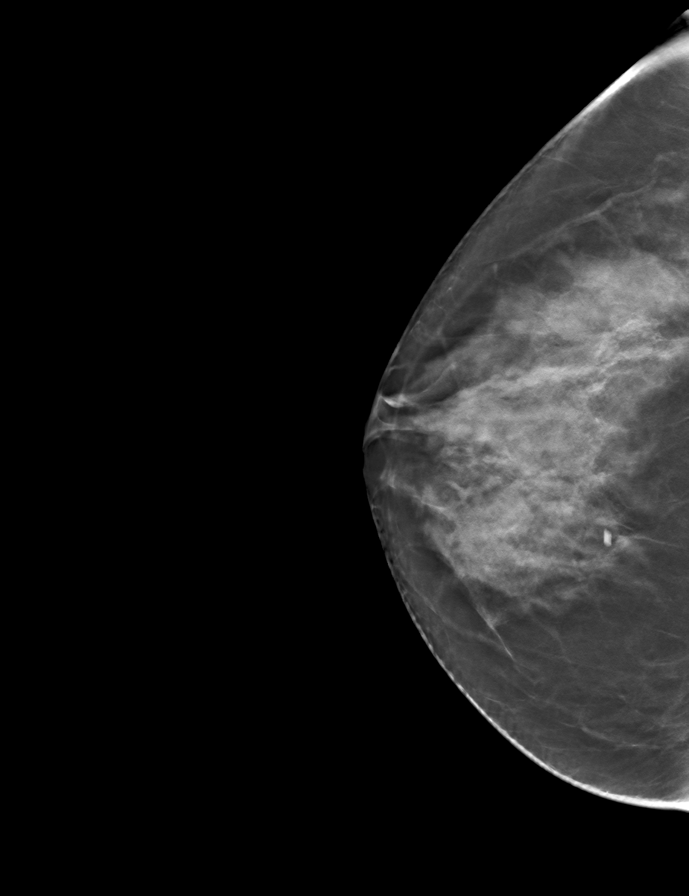

[R MLO tomo · tomo slice 37/74.0]
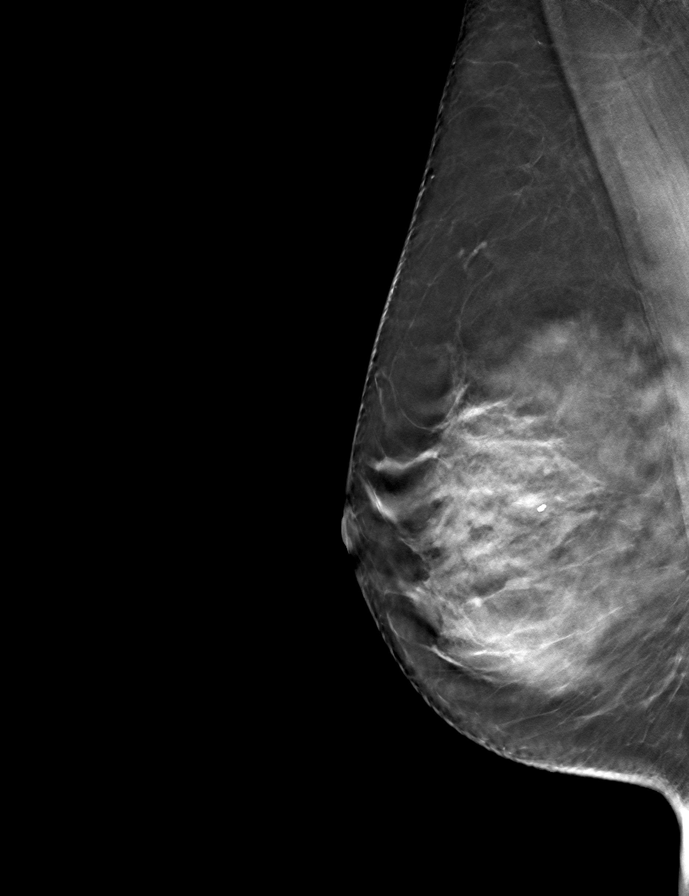

[L CC tomo · tomo slice 37/72.0]
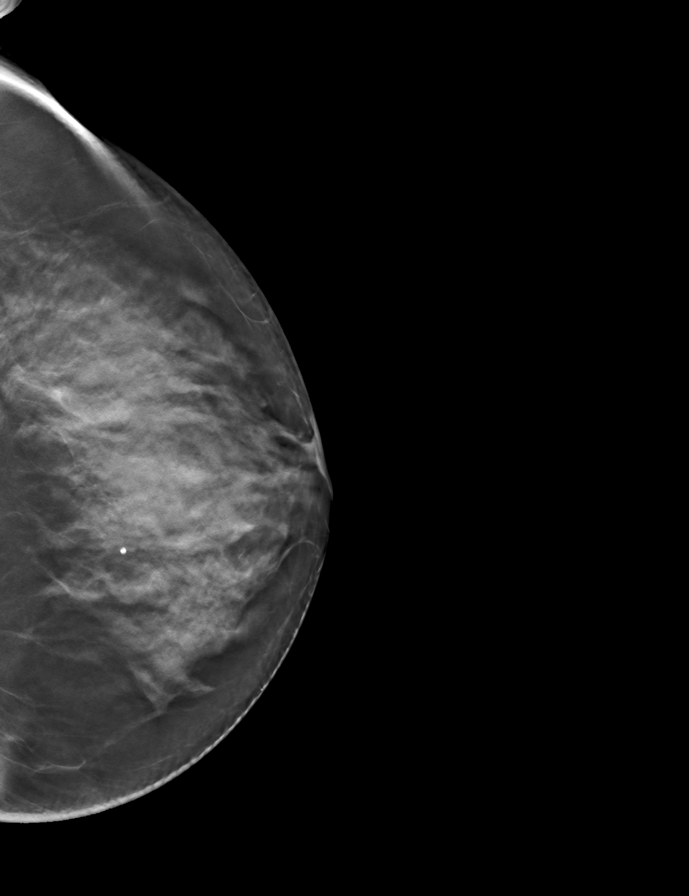

[9 of 24 positions shown; findings below may reference images not displayed]

ACR Breast Density Category d: The breast tissue is extremely dense,
which lowers the sensitivity of mammography.
FINDINGS: There are no findings suspicious for malignancy. Images were
processed with CAD.
IMPRESSION: No mammographic evidence of malignancy. A result letter of this
screening mammogram will be mailed directly to the patient.

RECOMMENDATION:
Screening mammogram in one year. (Code:RA-I-AVB)

BI-RADS CATEGORY  1: Negative.

## 2019-02-04 DIAGNOSIS — Z0000 Encounter for general adult medical examination without abnormal findings: Secondary | ICD-10-CM | POA: Diagnosis not present

## 2019-02-04 DIAGNOSIS — M810 Age-related osteoporosis without current pathological fracture: Secondary | ICD-10-CM | POA: Diagnosis not present

## 2019-02-18 DIAGNOSIS — Z1339 Encounter for screening examination for other mental health and behavioral disorders: Secondary | ICD-10-CM | POA: Diagnosis not present

## 2019-02-18 DIAGNOSIS — M810 Age-related osteoporosis without current pathological fracture: Secondary | ICD-10-CM | POA: Diagnosis not present

## 2019-02-18 DIAGNOSIS — Z1331 Encounter for screening for depression: Secondary | ICD-10-CM | POA: Diagnosis not present

## 2019-02-18 DIAGNOSIS — E538 Deficiency of other specified B group vitamins: Secondary | ICD-10-CM | POA: Diagnosis not present

## 2019-02-18 DIAGNOSIS — Z0000 Encounter for general adult medical examination without abnormal findings: Secondary | ICD-10-CM | POA: Diagnosis not present

## 2019-03-03 ENCOUNTER — Other Ambulatory Visit: Payer: Self-pay

## 2019-03-03 NOTE — Progress Notes (Signed)
64 y.o. G0P0 Married Caucasian Fe here for annual exam. Post menopausal no hormone replacement. Denies vaginal bleeding or vaginal dryness. Hot flashes and night sweats. Had recent visit with PCP Dr. Brigitte Pulse, labs were better especially cholesterol, eating better. Weight loss of 10 pounds. No other health issues today. Has taken first Covid vaccine.  Patient's last menstrual period was 09/20/2010.          Sexually active: Yes.    The current method of family planning is post menopausal status.    Exercising: Yes.    running & stretching Smoker:  no  Review of Systems  Constitutional: Negative.   HENT: Negative.   Eyes: Negative.   Respiratory: Negative.   Cardiovascular: Negative.   Gastrointestinal: Negative.   Genitourinary: Negative.   Musculoskeletal: Negative.   Skin: Negative.   Neurological: Negative.   Endo/Heme/Allergies: Negative.   Psychiatric/Behavioral: Negative.     Health Maintenance: Pap:  03-11-13 neg HPV HR neg, 02-22-2018 neg HPV HR neg History of Abnormal Pap: no MMG:  10/2018 bilateral & rt breast 11/2018 birads 1:neg Self Breast exams: no Colonoscopy:  2015 f/u 70yrs BMD:   2019 osteopenia Dr. Brigitte Pulse manages. TDaP:  UTD Shingles: not done Pneumonia: not done Hep C and HIV: not done Labs: PCP   reports that she has quit smoking. She has never used smokeless tobacco. She reports that she does not drink alcohol or use drugs.  Past Medical History:  Diagnosis Date  . Osteopenia     Past Surgical History:  Procedure Laterality Date  . NO PAST SURGERIES      Current Outpatient Medications  Medication Sig Dispense Refill  . Alendronate Sodium (FOSAMAX PO) Take by mouth.    . Multiple Vitamin (MULTIVITAMIN) tablet Take 1 tablet by mouth.     Marland Kitchen VITAMIN D PO Take 5,000 Int'l Units by mouth. 5 times a week     No current facility-administered medications for this visit.    Family History  Problem Relation Age of Onset  . Breast cancer Mother   .  Hypertension Mother   . Kidney disease Mother   . Stroke Mother        mini stroke  . COPD Father   . Lung cancer Father        smoker  . Breast cancer Maternal Aunt   . Breast cancer Maternal Aunt     ROS:  Pertinent items are noted in HPI.  Otherwise, a comprehensive ROS was negative.  Exam:   BP 104/64   Pulse 68   Temp (!) 97.1 F (36.2 C) (Skin)   Resp 16   Ht 5' 2.5" (1.588 m)   Wt 122 lb (55.3 kg)   LMP 09/20/2010   BMI 21.96 kg/m  Height: 5' 2.5" (158.8 cm) Ht Readings from Last 3 Encounters:  03/04/19 5' 2.5" (1.588 m)  02/22/18 5' 2.25" (1.581 m)  03/11/13 5\' 3"  (1.6 m)    General appearance: alert, cooperative and appears stated age Head: Normocephalic, without obvious abnormality, atraumatic Neck: no adenopathy, supple, symmetrical, trachea midline and thyroid normal to inspection and palpation Lungs: clear to auscultation bilaterally Breasts: normal appearance, no masses or tenderness, No nipple retraction or dimpling, No nipple discharge or bleeding, No axillary or supraclavicular adenopathy Heart: regular rate and rhythm Abdomen: soft, non-tender; no masses,  no organomegaly Extremities: extremities normal, atraumatic, no cyanosis or edema Skin: Skin color, texture, turgor normal. No rashes or lesions Lymph nodes: Cervical, supraclavicular, and axillary nodes normal. No  abnormal inguinal nodes palpated Neurologic: Grossly normal   Pelvic: External genitalia:  no lesions              Urethra:  normal appearing urethra with no masses, tenderness or lesions              Bartholin's and Skene's: normal                 Vagina: normal appearing vagina with normal color and discharge, no lesions              Cervix: no cervical motion tenderness, no lesions and normal appearance              Pap taken: No. Bimanual Exam:  Uterus:  normal size, contour, position, consistency, mobility, non-tender and anteverted              Adnexa: normal adnexa and no mass,  fullness, tenderness               Rectovaginal: Confirms               Anus:  normal sphincter tone, no lesions  Chaperone present: yes  A:  Well Woman with normal exam  Post menopausal no HRT  Osteopenia on Fosamax with PCP management      P:   Reviewed health and wellness pertinent to exam  Aware of need to advise if vaginal bleeding.  Discussed coconut oil use for vaginal dryness  Continue follow up with PCP as indicated.  Pap smear: no    counseled on breast exam, mammography screening, feminine hygiene, menopause, adequate intake of calcium and vitamin D, diet and exercise  return annually or prn  An After Visit Summary was printed and given to the patient.

## 2019-03-04 ENCOUNTER — Other Ambulatory Visit: Payer: Self-pay

## 2019-03-04 ENCOUNTER — Ambulatory Visit (INDEPENDENT_AMBULATORY_CARE_PROVIDER_SITE_OTHER): Payer: 59 | Admitting: Certified Nurse Midwife

## 2019-03-04 ENCOUNTER — Encounter: Payer: Self-pay | Admitting: Certified Nurse Midwife

## 2019-03-04 VITALS — BP 104/64 | HR 68 | Temp 97.1°F | Resp 16 | Ht 62.5 in | Wt 122.0 lb

## 2019-03-04 DIAGNOSIS — N951 Menopausal and female climacteric states: Secondary | ICD-10-CM | POA: Diagnosis not present

## 2019-03-04 DIAGNOSIS — Z01419 Encounter for gynecological examination (general) (routine) without abnormal findings: Secondary | ICD-10-CM

## 2019-03-04 NOTE — Patient Instructions (Signed)

## 2019-04-29 MED FILL — ALENDRONATE NA 70 MG TAB: 70 | 84 days supply | Qty: 12 | Fill #1

## 2019-05-13 ENCOUNTER — Encounter: Payer: Self-pay | Admitting: Certified Nurse Midwife

## 2019-05-14 MED FILL — CHLORHEXIDINE 0.12% RINSE: 0.12 | 16 days supply | Qty: 473 | Fill #0

## 2019-09-25 DIAGNOSIS — L720 Epidermal cyst: Secondary | ICD-10-CM | POA: Diagnosis not present

## 2019-11-03 ENCOUNTER — Other Ambulatory Visit (HOSPITAL_COMMUNITY): Payer: Self-pay | Admitting: Internal Medicine

## 2019-11-03 DIAGNOSIS — Z1231 Encounter for screening mammogram for malignant neoplasm of breast: Secondary | ICD-10-CM

## 2019-11-28 ENCOUNTER — Other Ambulatory Visit (HOSPITAL_COMMUNITY): Payer: Self-pay | Admitting: Internal Medicine

## 2019-11-28 MED FILL — ALENDRONATE NA 70 MG TAB: 70 | 84 days supply | Qty: 12 | Fill #0

## 2019-12-05 ENCOUNTER — Other Ambulatory Visit: Payer: Self-pay

## 2019-12-05 ENCOUNTER — Ambulatory Visit (HOSPITAL_COMMUNITY)
Admission: RE | Admit: 2019-12-05 | Discharge: 2019-12-05 | Disposition: A | Discharge status: Home / Self Care | Payer: 59 | Source: Ambulatory Visit | Attending: Internal Medicine | Admitting: Internal Medicine

## 2019-12-05 DIAGNOSIS — Z1231 Encounter for screening mammogram for malignant neoplasm of breast: Secondary | ICD-10-CM | POA: Diagnosis not present

## 2020-02-16 MED FILL — ALENDRONATE NA 70 MG TAB: 70 | 84 days supply | Qty: 12 | Fill #1

## 2020-02-27 DIAGNOSIS — M810 Age-related osteoporosis without current pathological fracture: Secondary | ICD-10-CM | POA: Diagnosis not present

## 2020-02-27 DIAGNOSIS — E538 Deficiency of other specified B group vitamins: Secondary | ICD-10-CM | POA: Diagnosis not present

## 2020-02-27 DIAGNOSIS — Z0000 Encounter for general adult medical examination without abnormal findings: Secondary | ICD-10-CM | POA: Diagnosis not present

## 2020-03-05 DIAGNOSIS — M810 Age-related osteoporosis without current pathological fracture: Secondary | ICD-10-CM | POA: Diagnosis not present

## 2020-03-05 DIAGNOSIS — M542 Cervicalgia: Secondary | ICD-10-CM | POA: Diagnosis not present

## 2020-03-05 DIAGNOSIS — Z1339 Encounter for screening examination for other mental health and behavioral disorders: Secondary | ICD-10-CM | POA: Diagnosis not present

## 2020-03-05 DIAGNOSIS — Z1331 Encounter for screening for depression: Secondary | ICD-10-CM | POA: Diagnosis not present

## 2020-03-05 DIAGNOSIS — Z0000 Encounter for general adult medical examination without abnormal findings: Secondary | ICD-10-CM | POA: Diagnosis not present

## 2020-03-05 DIAGNOSIS — M545 Low back pain, unspecified: Secondary | ICD-10-CM | POA: Diagnosis not present

## 2020-03-05 DIAGNOSIS — E538 Deficiency of other specified B group vitamins: Secondary | ICD-10-CM | POA: Diagnosis not present

## 2020-03-29 DIAGNOSIS — M810 Age-related osteoporosis without current pathological fracture: Secondary | ICD-10-CM | POA: Diagnosis not present

## 2020-03-29 DIAGNOSIS — M545 Low back pain, unspecified: Secondary | ICD-10-CM | POA: Diagnosis not present

## 2020-03-29 DIAGNOSIS — E538 Deficiency of other specified B group vitamins: Secondary | ICD-10-CM | POA: Diagnosis not present

## 2020-06-01 ENCOUNTER — Other Ambulatory Visit (HOSPITAL_COMMUNITY): Payer: Self-pay

## 2020-11-24 ENCOUNTER — Other Ambulatory Visit (HOSPITAL_COMMUNITY): Payer: Self-pay | Admitting: Internal Medicine

## 2020-11-24 DIAGNOSIS — Z1231 Encounter for screening mammogram for malignant neoplasm of breast: Secondary | ICD-10-CM

## 2020-11-29 ENCOUNTER — Other Ambulatory Visit: Payer: Self-pay

## 2020-11-29 ENCOUNTER — Emergency Department (HOSPITAL_BASED_OUTPATIENT_CLINIC_OR_DEPARTMENT_OTHER)
Admission: EM | Admit: 2020-11-29 | Discharge: 2020-11-29 | Disposition: A | Discharge status: Home / Self Care | Payer: 59 | Attending: Emergency Medicine | Admitting: Emergency Medicine

## 2020-11-29 ENCOUNTER — Encounter (HOSPITAL_BASED_OUTPATIENT_CLINIC_OR_DEPARTMENT_OTHER): Payer: Self-pay

## 2020-11-29 DIAGNOSIS — Z87891 Personal history of nicotine dependence: Secondary | ICD-10-CM | POA: Diagnosis not present

## 2020-11-29 DIAGNOSIS — U071 COVID-19: Secondary | ICD-10-CM | POA: Diagnosis not present

## 2020-11-29 DIAGNOSIS — R42 Dizziness and giddiness: Secondary | ICD-10-CM | POA: Insufficient documentation

## 2020-11-29 NOTE — ED Triage Notes (Signed)
Pt tested positive for COVID this morning and c/o a cough and sore throat. Pt would like a second COVID test for work to confirm. Denies SOB or chest pain.

## 2020-11-29 NOTE — Discharge Instructions (Signed)
You were seen today for symptoms related to COVID-19.  You do not need repeat testing if your home test was positive.  This is a presumptive positive.  You are vaccinated.  You need to quarantine for 5 days or until you are symptom-free whichever is longer.  Call health at work for instructions regarding return to work.

## 2020-11-29 NOTE — ED Notes (Signed)
Pt verbalizes understanding of discharge instructions. Opportunity for questioning and answers were provided. [place] removed by staff, pt discharged from ED to home. Educated to contact work for positive COVID test.

## 2020-11-29 NOTE — ED Provider Notes (Signed)
[facility] EMERGENCY DEPT Provider Note   CSN: [id number] Arrival date & time: 11/29/20  9678     History Chief Complaint  Patient presents with   Cough   Covid Positive    Stephanie Morrow is a 65 y.o. female.  HPI     This is a 65 year old female with a history of osteopenia who presents with cough, chills, decreased appetite, dizziness.  Patient reports that she tested positive for COVID-19 this morning.  She got her flu shot on Thursday.  She began to have upper respiratory symptoms on Friday and reports that she stayed in bed most of the day.  She states she has had decreased appetite and cough that has been nonproductive.  No shortness of breath or chest pain.  She is vaccinated x2.  She states that she woke up this morning feeling dizzy and attributed it to not eating or drinking much of the last few days.  However, she took a home test and it was positive.  She took a test at work on Friday that was negative.    Patient presents mostly because she is concerned she needs repeat testing for confirmation.  Past Medical History:  Diagnosis Date   Osteopenia     There are no problems to display for this patient.   Past Surgical History:  Procedure Laterality Date   NO PAST SURGERIES       OB History     Gravida  0   Para  0   Term      Preterm      AB      Living         SAB      IAB      Ectopic      Multiple      Live Births              Family History  Problem Relation Age of Onset   Breast cancer Mother    Hypertension Mother    Kidney disease Mother    Stroke Mother        mini stroke   COPD Father    Lung cancer Father        smoker   Breast cancer Maternal Aunt    Breast cancer Maternal Aunt     Social History   Tobacco Use   Smoking status: Former   Smokeless tobacco: Never  Substance Use Topics   Alcohol use: No   Drug use: No    Home Medications Prior to Admission medications   Medication Sig  Start Date End Date Taking? Authorizing Provider  Alendronate Sodium (FOSAMAX PO) Take by mouth.    [provider]  Multiple Vitamin (MULTIVITAMIN) tablet Take 1 tablet by mouth.     [provider]  VITAMIN D PO Take 5,000 Int'l Units by mouth. 5 times a week    [provider]    Allergies    Patient has no known allergies.  Review of Systems   Review of Systems  Constitutional:  Positive for chills. Negative for fever.  HENT:  Positive for congestion.   Respiratory:  Positive for cough. Negative for shortness of breath.   Cardiovascular:  Negative for chest pain.  Gastrointestinal:  Positive for nausea. Negative for diarrhea and vomiting.  Genitourinary:  Negative for dysuria.  All other systems reviewed and are negative.  Physical Exam Updated Vital Signs BP 117/74 (BP Location: Right Arm)   Pulse 76  Temp 98.7 F (37.1 C) (Oral)   Resp 19   Ht 1.6 m (5\' 3" )   Wt 56.7 kg   LMP 09/20/2010   SpO2 97%   BMI 22.14 kg/m   Physical Exam Vitals and nursing note reviewed.  Constitutional:      Appearance: She is well-developed. She is not ill-appearing.  HENT:     Head: Normocephalic and atraumatic.     Nose: Nose normal.     Mouth/Throat:     Mouth: Mucous membranes are moist.     Pharynx: No oropharyngeal exudate.  Eyes:     Pupils: Pupils are equal, round, and reactive to light.  Cardiovascular:     Rate and Rhythm: Normal rate and regular rhythm.     Heart sounds: Normal heart sounds.  Pulmonary:     Effort: Pulmonary effort is normal. No respiratory distress.     Breath sounds: No wheezing.  Abdominal:     General: Bowel sounds are normal.     Palpations: Abdomen is soft.  Musculoskeletal:     Cervical back: Neck supple.     Right lower leg: No edema.     Left lower leg: No edema.  Skin:    General: Skin is warm and dry.  Neurological:     Mental Status: She is alert and oriented to person, place, and time.  Psychiatric:         Mood and Affect: Mood normal.    ED Results / Procedures / Treatments   Labs (all labs ordered are listed, but only abnormal results are displayed) Labs Reviewed - No data to display  EKG None  Radiology No results found.  Procedures Procedures   Medications Ordered in ED Medications - No data to display  ED Course  I have reviewed the triage vital signs and the nursing notes.  Pertinent labs & imaging results that were available during my care of the patient were reviewed by me and considered in my medical decision making (see chart for details).    MDM Rules/Calculators/A&P                           Patient presents with upper respiratory symptoms consistent with COVID-19.  Positive testing this morning.  She is nontoxic.  Vital signs are reassuring.  She is not hypoxic.  Physical exam is fairly benign.  Her symptoms are consistent with COVID-19.  Doubt bacterial pneumonia or other superimposed infection.  I discussed with patient that I did not believe she needs further testing.  Given that her point-of-care testing was positive this would be a true positive given her symptoms.  Will state this on her discharge instructions.  We discussed supportive measures at home including fluids, Tylenol, ibuprofen as needed.  Given her age, she would be a candidate for Paxlovid.  We discussed risks and benefits.  Patient declined.  After history, exam, and medical workup I feel the patient has been appropriately medically screened and is safe for discharge home. Pertinent diagnoses were discussed with the patient. Patient was given return precautions.  Stephanie Morrow was evaluated in Emergency Department on 11/29/2020 for the symptoms described in the history of present illness. She was evaluated in the context of the global COVID-19 pandemic, which necessitated consideration that the patient might be at risk for infection with the SARS-CoV-2 virus that causes COVID-19. Institutional  protocols and algorithms that pertain to the evaluation of patients at risk for COVID-19  are in a state of rapid change based on information released by regulatory bodies including the CDC and federal and state organizations. These policies and algorithms were followed during the patient's care in the ED.  Final Clinical Impression(s) / ED Diagnoses Final diagnoses:  [id number]    Rx / DC Orders ED Discharge Orders     None        Merryl Hacker, MD 11/29/20 [phone]

## 2020-12-06 ENCOUNTER — Ambulatory Visit (HOSPITAL_COMMUNITY)
Admission: RE | Admit: 2020-12-06 | Discharge: 2020-12-06 | Disposition: A | Discharge status: Home / Self Care | Payer: 59 | Source: Ambulatory Visit | Attending: Internal Medicine | Admitting: Internal Medicine

## 2020-12-06 ENCOUNTER — Other Ambulatory Visit: Payer: Self-pay

## 2020-12-06 DIAGNOSIS — Z1231 Encounter for screening mammogram for malignant neoplasm of breast: Secondary | ICD-10-CM | POA: Insufficient documentation

## 2020-12-08 DIAGNOSIS — E538 Deficiency of other specified B group vitamins: Secondary | ICD-10-CM | POA: Diagnosis not present

## 2020-12-08 DIAGNOSIS — M810 Age-related osteoporosis without current pathological fracture: Secondary | ICD-10-CM | POA: Diagnosis not present

## 2021-03-29 DIAGNOSIS — Z79899 Other long term (current) drug therapy: Secondary | ICD-10-CM | POA: Diagnosis not present

## 2021-03-29 DIAGNOSIS — M810 Age-related osteoporosis without current pathological fracture: Secondary | ICD-10-CM | POA: Diagnosis not present

## 2021-03-29 DIAGNOSIS — E538 Deficiency of other specified B group vitamins: Secondary | ICD-10-CM | POA: Diagnosis not present

## 2021-03-29 DIAGNOSIS — R7989 Other specified abnormal findings of blood chemistry: Secondary | ICD-10-CM | POA: Diagnosis not present

## 2021-03-31 ENCOUNTER — Encounter: Payer: Self-pay | Admitting: Internal Medicine

## 2021-04-05 DIAGNOSIS — B009 Herpesviral infection, unspecified: Secondary | ICD-10-CM | POA: Diagnosis not present

## 2021-04-05 DIAGNOSIS — Z1331 Encounter for screening for depression: Secondary | ICD-10-CM | POA: Diagnosis not present

## 2021-04-05 DIAGNOSIS — M810 Age-related osteoporosis without current pathological fracture: Secondary | ICD-10-CM | POA: Diagnosis not present

## 2021-04-05 DIAGNOSIS — Z0000 Encounter for general adult medical examination without abnormal findings: Secondary | ICD-10-CM | POA: Diagnosis not present

## 2021-04-05 DIAGNOSIS — R82998 Other abnormal findings in urine: Secondary | ICD-10-CM | POA: Diagnosis not present

## 2021-04-05 DIAGNOSIS — M542 Cervicalgia: Secondary | ICD-10-CM | POA: Diagnosis not present

## 2021-04-05 DIAGNOSIS — E538 Deficiency of other specified B group vitamins: Secondary | ICD-10-CM | POA: Diagnosis not present

## 2021-04-05 DIAGNOSIS — Z1212 Encounter for screening for malignant neoplasm of rectum: Secondary | ICD-10-CM | POA: Diagnosis not present

## 2021-04-05 DIAGNOSIS — M545 Low back pain, unspecified: Secondary | ICD-10-CM | POA: Diagnosis not present

## 2021-04-05 DIAGNOSIS — Z1339 Encounter for screening examination for other mental health and behavioral disorders: Secondary | ICD-10-CM | POA: Diagnosis not present

## 2021-04-11 ENCOUNTER — Encounter: Payer: Self-pay | Admitting: Internal Medicine

## 2021-05-17 ENCOUNTER — Other Ambulatory Visit: Payer: Self-pay

## 2021-05-17 ENCOUNTER — Ambulatory Visit (AMBULATORY_SURGERY_CENTER): Payer: 59 | Admitting: *Deleted

## 2021-05-17 ENCOUNTER — Other Ambulatory Visit (HOSPITAL_COMMUNITY): Payer: Self-pay

## 2021-05-17 VITALS — Ht 62.0 in | Wt 130.0 lb

## 2021-05-17 DIAGNOSIS — Z1211 Encounter for screening for malignant neoplasm of colon: Secondary | ICD-10-CM

## 2021-05-17 MED ORDER — NA SULFATE-K SULFATE-MG SULF 17.5-3.13-1.6 GM/177ML PO SOLN
1.0000 | Freq: Once | ORAL | 0 refills | Status: AC
  Filled 2021-05-17: qty 354, 1d supply, fill #0

## 2021-05-17 NOTE — Progress Notes (Signed)

## 2021-05-23 ENCOUNTER — Encounter: Payer: Self-pay | Admitting: Internal Medicine

## 2021-05-31 ENCOUNTER — Encounter: Payer: Self-pay | Admitting: Internal Medicine

## 2021-05-31 ENCOUNTER — Ambulatory Visit (AMBULATORY_SURGERY_CENTER): Payer: 59 | Admitting: Internal Medicine

## 2021-05-31 VITALS — BP 114/72 | HR 56 | Temp 97.5°F | Resp 15 | Ht 62.0 in | Wt 130.0 lb

## 2021-05-31 DIAGNOSIS — Z1211 Encounter for screening for malignant neoplasm of colon: Secondary | ICD-10-CM | POA: Diagnosis not present

## 2021-05-31 MED ORDER — SODIUM CHLORIDE 0.9 % IV SOLN: 500.0000 mL | Freq: Once | INTRAVENOUS | Status: DC

## 2021-05-31 NOTE — Op Note (Signed)
[facility] ?Patient Name: Stephanie Morrow ?Procedure Date: 05/31/2021 7:57 AM ?MRN: [id number] ?Endoscopist: Gatha Mayer , MD ?Age: 66 ?Referring MD:  ?Date of Birth: December 14, 1955 ?Gender: Female ?Account #: [id number] ?Procedure:                Colonoscopy ?Indications:              Screening for colorectal malignant neoplasm, Last  ?                          colonoscopy: 2012 ?Medicines:                Monitored Anesthesia Care ?Procedure:                Pre-Anesthesia Assessment: ?                          - Prior to the procedure, a History and Physical  ?                          was performed, and patient medications and  ?                          allergies were reviewed. The patient's tolerance of  ?                          previous anesthesia was also reviewed. The risks  ?                          and benefits of the procedure and the sedation  ?                          options and risks were discussed with the patient.  ?                          All questions were answered, and informed consent  ?                          was obtained. Prior Anticoagulants: The patient has  ?                          taken no previous anticoagulant or antiplatelet  ?                          agents. ASA Grade Assessment: II - A patient with  ?                          mild systemic disease. After reviewing the risks  ?                          and benefits, the patient was deemed in  ?                          satisfactory condition to undergo the procedure. ?  After obtaining informed consent, the colonoscope  ?                          was passed under direct vision. Throughout the  ?                          procedure, the patient's blood pressure, pulse, and  ?                          oxygen saturations were monitored continuously. The  ?                          CF [id number] #[id number] was introduced through the anus  ?                          and advanced to the the cecum, identified  by  ?                          appendiceal orifice and ileocecal valve. The  ?                          colonoscopy was somewhat difficult due to  ?                          significant looping. Successful completion of the  ?                          procedure was aided by applying abdominal pressure.  ?                          The patient tolerated the procedure well. The  ?                          quality of the bowel preparation was excellent. The  ?                          bowel preparation used was SUPREP via split dose  ?                          instruction. The ileocecal valve, appendiceal  ?                          orifice, and rectum were photographed. ?Scope In: 8:04:16 AM ?Scope Out: 8:25:27 AM ?Scope Withdrawal Time: 0 hours 12 minutes 32 seconds  ?Total Procedure Duration: 0 hours 21 minutes 11 seconds  ?Findings:                 The perianal and digital rectal examinations were  ?                          normal. ?                          Multiple diverticula were found in the sigmoid  ?  colon. ?                          The exam was otherwise without abnormality on  ?                          direct and retroflexion views. ?Complications:            No immediate complications. ?Estimated Blood Loss:     Estimated blood loss: none. ?Impression:               - Diverticulosis in the sigmoid colon. ?                          - The examination was otherwise normal on direct  ?                          and retroflexion views. ?                          - No specimens collected. ?Recommendation:           - Patient has a contact number available for  ?                          emergencies. The signs and symptoms of potential  ?                          delayed complications were discussed with the  ?                          patient. Return to normal activities tomorrow.  ?                          Written discharge instructions were provided to the  ?                           patient. ?                          - Resume previous diet. ?                          - Continue present medications. ?                          - Repeat colonoscopy in 10 years for screening  ?                          purposes. ?Gatha Mayer, MD ?05/31/2021 8:32:54 AM ?This report has been signed electronically. ?

## 2021-05-31 NOTE — Progress Notes (Signed)
Pt's states no medical or surgical changes since previsit or office visit. 

## 2021-05-31 NOTE — Progress Notes (Signed)
[facility] History and Physical ? ? ?Primary Care Physician:  Ginger Organ., MD ? ? ?Reason for Procedure:   CRCA screening ? ?Plan:    colonoscopy ? ? ? ? ?HPI: Stephanie Morrow is a 67 y.o. female [place] screening exam ? ? ?Past Medical History:  ?Diagnosis Date  ? Allergy   ? OCCASIONALLY,NO MEDS  ? Osteopenia   ? ? ?Past Surgical History:  ?Procedure Laterality Date  ? COLONOSCOPY    ? ? ?Prior to Admission medications   ?Medication Sig Start Date End Date Taking? Authorizing Provider  ?Cyanocobalamin (VITAMIN B-12 SL) Place under the tongue daily. 5 ML   Yes [provider]  ?Multiple Vitamin (MULTIVITAMIN) tablet Take 1 tablet by mouth daily.   Yes [provider]  ?VITAMIN D PO Take 5,000 Int'l Units by mouth. 5 times a week   Yes [provider]  ? ? ?Current Outpatient Medications  ?Medication Sig Dispense Refill  ? Cyanocobalamin (VITAMIN B-12 SL) Place under the tongue daily. 5 ML    ? Multiple Vitamin (MULTIVITAMIN) tablet Take 1 tablet by mouth daily.    ? VITAMIN D PO Take 5,000 Int'l Units by mouth. 5 times a week    ? ?Current Facility-Administered Medications  ?Medication Dose Route Frequency Provider Last Rate Last Admin  ? 0.9 %  sodium chloride infusion  500 mL Intravenous Once Gatha Mayer, MD      ? ? ?Allergies as of 05/31/2021  ? (No Known Allergies)  ? ? ?Family History  ?Problem Relation Age of Onset  ? Breast cancer Mother   ? Hypertension Mother   ? Kidney disease Mother   ? Stroke Mother   ?     mini stroke  ? COPD Father   ? Lung cancer Father   ?     smoker  ? Breast cancer Maternal Aunt   ? Breast cancer Maternal Aunt   ? Colon cancer Neg Hx   ? Colon polyps Neg Hx   ? Celiac disease Neg Hx   ? Crohn's disease Neg Hx   ? Esophageal cancer Neg Hx   ? Rectal cancer Neg Hx   ? Stomach cancer Neg Hx   ? ? ?Social History  ? ?Socioeconomic History  ? Marital status: Married  ?  Spouse name: Not on file  ? Number of children: 0  ? Years of  education: Not on file  ? Highest education level: Not on file  ?Occupational History  ?  Employer: [employer]  ?Tobacco Use  ? Smoking status: Former  ?  Passive exposure: Past (40 YEARS AGO)  ? Smokeless tobacco: Never  ?Vaping Use  ? Vaping Use: Never used  ?Substance and Sexual Activity  ? Alcohol use: No  ? Drug use: No  ? Sexual activity: Yes  ?  Partners: Male  ?  Birth control/protection: Post-menopausal, Other-see comments  ?  Comment: husband vasectomy  ?Other Topics Concern  ? Not on file  ?Social History Narrative  ? Not on file  ? ?Social Determinants of Health  ? ?Financial Resource Strain: Not on file  ?Food Insecurity: Not on file  ?Transportation Needs: Not on file  ?Physical Activity: Not on file  ?Stress: Not on file  ?Social Connections: Not on file  ?Intimate Partner Violence: Not on file  ? ? ?Review of Systems: ? ?All other review of systems negative except as mentioned in the HPI. ? ?Physical Exam: ?Vital signs ?  BP (!) 141/76   Pulse 60   Temp (!) 97.5 ?F (36.4 ?C)   Ht '5\' 2"'$  (1.575 m)   Wt 130 lb (59 kg)   LMP 09/20/2010   SpO2 99%   BMI 23.78 kg/m?  ? ?General:   Alert,  Well-developed, well-nourished, pleasant and cooperative in NAD ?Lungs:  Clear throughout to auscultation.   ?Heart:  Regular rate and rhythm; no murmurs, clicks, rubs,  or gallops. ?Abdomen:  Soft, nontender and nondistended. Normal bowel sounds.   ?Neuro/Psych:  Alert and cooperative. Normal mood and affect. A and O x 3 ? ? ?'@[name]'$  [name], MD, [name] ?[facility] ?[phone] (pager) ?05/31/2021 8:00 AM@ ? ?

## 2021-05-31 NOTE — Patient Instructions (Addendum)
No polyps or cancer seen today! ? ?You do have diverticulosis - thickened muscle rings and pouches in the colon wall. Please read the handout about this condition. ? ?Next routine colonoscopy or other screening test in 10 years - 2033. ? ?I appreciate the opportunity to care for you. ?Gatha Mayer, MD, [name] ? ? ?YOU HAD AN ENDOSCOPIC PROCEDURE TODAY AT [facility]:   Refer to the procedure report that was given to you for any specific questions about what was found during the examination.  If the procedure report does not answer your questions, please call your gastroenterologist to clarify.  If you requested that your care partner not be given the details of your procedure findings, then the procedure report has been included in a sealed envelope for you to review at your convenience later. ? ?YOU SHOULD EXPECT: Some feelings of bloating in the abdomen. Passage of more gas than usual.  Walking can help get rid of the air that was put into your GI tract during the procedure and reduce the bloating. If you had a lower endoscopy (such as a colonoscopy or flexible sigmoidoscopy) you may notice spotting of blood in your stool or on the toilet paper. If you underwent a bowel prep for your procedure, you may not have a normal bowel movement for a few days. ? ?Please Note:  You might notice some irritation and congestion in your nose or some drainage.  This is from the oxygen used during your procedure.  There is no need for concern and it should clear up in a day or so. ? ?SYMPTOMS TO REPORT IMMEDIATELY: ? ?Following lower endoscopy (colonoscopy or flexible sigmoidoscopy): ? Excessive amounts of blood in the stool ? Significant tenderness or worsening of abdominal pains ? Swelling of the abdomen that is new, acute ? Fever of 100?F or higher ? ?For urgent or emergent issues, a gastroenterologist can be reached at any hour by calling [phone]. ?Do not use MyChart messaging for urgent concerns.   ? ? ?DIET:  We do recommend a small meal at first, but then you may proceed to your regular diet.  Drink plenty of fluids but you should avoid alcoholic beverages for 24 hours. ? ?ACTIVITY:  You should plan to take it easy for the rest of today and you should NOT DRIVE or use heavy machinery until tomorrow (because of the sedation medicines used during the test).   ? ?FOLLOW UP: ?Our staff will call the number listed on your records 48-72 hours following your procedure to check on you and address any questions or concerns that you may have regarding the information given to you following your procedure. If we do not reach you, we will leave a message.  We will attempt to reach you two times.  During this call, we will ask if you have developed any symptoms of COVID 19. If you develop any symptoms (ie: fever, flu-like symptoms, shortness of breath, cough etc.) before then, please call [phone].  If you test positive for Covid 19 in the 2 weeks post procedure, please call and report this information to [place].   ? ?If any biopsies were taken you will be contacted by phone or by letter within the next 1-3 weeks.  Please call us at [phone] if you have not heard about the biopsies in 3 weeks.  ? ? ?SIGNATURES/CONFIDENTIALITY: ?You and/or your care partner have signed paperwork which will be entered into your electronic medical record.  These  signatures attest to the fact that that the information above on your After Visit Summary has been reviewed and is understood.  Full responsibility of the confidentiality of this discharge information lies with you and/or your care-partner.  ?

## 2021-05-31 NOTE — Progress Notes (Signed)
Pt non-responsive, VVS, Report to RN  °

## 2021-06-02 ENCOUNTER — Telehealth: Payer: Self-pay

## 2021-06-02 NOTE — Telephone Encounter (Signed)
?  Follow up Call- ? ? ?  05/31/2021  ?  7:17 AM  ?Call back number  ?Post procedure Call Back phone  # [phone]  ?Permission to leave phone message Yes  ?  ? ?Patient questions: ? ?Do you have a fever, pain , or abdominal swelling? No. ?Pain Score  0 * ? ?Have you tolerated food without any problems? Yes.   ? ?Have you been able to return to your normal activities? Yes.   ? ?Do you have any questions about your discharge instructions: ?Diet   No. ?Medications  No. ?Follow up visit  No. ? ?Do you have questions or concerns about your Care? No. ? ?Actions: ?* If pain score is 4 or above: ?No action needed, pain <4. ? ?Have you developed a fever since your procedure? no ? ?2.   Have you had an respiratory symptoms (SOB or cough) since your procedure? no ? ?3.   Have you tested positive for COVID 19 since your procedure no ? ?4.   Have you had any family members/close contacts diagnosed with the COVID 19 since your procedure?  no ? ? ?If yes to any of these questions please route to Joylene John, RN and Joella Prince, RN  ? ? ?

## 2021-11-25 ENCOUNTER — Other Ambulatory Visit (HOSPITAL_COMMUNITY): Payer: Self-pay | Admitting: Internal Medicine

## 2021-11-25 DIAGNOSIS — Z1231 Encounter for screening mammogram for malignant neoplasm of breast: Secondary | ICD-10-CM

## 2021-12-09 ENCOUNTER — Ambulatory Visit (HOSPITAL_COMMUNITY)
Admission: RE | Admit: 2021-12-09 | Discharge: 2021-12-09 | Disposition: A | Discharge status: Home / Self Care | Payer: 59 | Source: Ambulatory Visit | Attending: Internal Medicine | Admitting: Internal Medicine

## 2021-12-09 DIAGNOSIS — Z1231 Encounter for screening mammogram for malignant neoplasm of breast: Secondary | ICD-10-CM | POA: Diagnosis not present

## 2022-04-12 DIAGNOSIS — R7989 Other specified abnormal findings of blood chemistry: Secondary | ICD-10-CM | POA: Diagnosis not present

## 2022-04-12 DIAGNOSIS — M810 Age-related osteoporosis without current pathological fracture: Secondary | ICD-10-CM | POA: Diagnosis not present

## 2022-04-12 DIAGNOSIS — E538 Deficiency of other specified B group vitamins: Secondary | ICD-10-CM | POA: Diagnosis not present

## 2022-04-12 DIAGNOSIS — Z79899 Other long term (current) drug therapy: Secondary | ICD-10-CM | POA: Diagnosis not present

## 2022-04-18 DIAGNOSIS — R82998 Other abnormal findings in urine: Secondary | ICD-10-CM | POA: Diagnosis not present

## 2022-11-15 DIAGNOSIS — D1801 Hemangioma of skin and subcutaneous tissue: Secondary | ICD-10-CM | POA: Diagnosis not present

## 2022-11-15 DIAGNOSIS — L821 Other seborrheic keratosis: Secondary | ICD-10-CM | POA: Diagnosis not present

## 2022-11-15 DIAGNOSIS — L814 Other melanin hyperpigmentation: Secondary | ICD-10-CM | POA: Diagnosis not present

## 2022-11-15 DIAGNOSIS — D2271 Melanocytic nevi of right lower limb, including hip: Secondary | ICD-10-CM | POA: Diagnosis not present

## 2022-11-15 DIAGNOSIS — L720 Epidermal cyst: Secondary | ICD-10-CM | POA: Diagnosis not present

## 2023-04-26 DIAGNOSIS — R7989 Other specified abnormal findings of blood chemistry: Secondary | ICD-10-CM | POA: Diagnosis not present

## 2023-04-26 DIAGNOSIS — M810 Age-related osteoporosis without current pathological fracture: Secondary | ICD-10-CM | POA: Diagnosis not present

## 2023-04-26 DIAGNOSIS — Z79899 Other long term (current) drug therapy: Secondary | ICD-10-CM | POA: Diagnosis not present

## 2023-04-27 ENCOUNTER — Other Ambulatory Visit (HOSPITAL_COMMUNITY): Payer: Self-pay | Admitting: Internal Medicine

## 2023-04-27 DIAGNOSIS — Z1231 Encounter for screening mammogram for malignant neoplasm of breast: Secondary | ICD-10-CM

## 2023-04-30 ENCOUNTER — Ambulatory Visit (HOSPITAL_COMMUNITY)
Admission: RE | Admit: 2023-04-30 | Discharge: 2023-04-30 | Disposition: A | Discharge status: Home / Self Care | Source: Ambulatory Visit | Attending: Internal Medicine | Admitting: Internal Medicine

## 2023-04-30 DIAGNOSIS — Z1231 Encounter for screening mammogram for malignant neoplasm of breast: Secondary | ICD-10-CM | POA: Diagnosis not present

## 2023-05-04 DIAGNOSIS — M545 Low back pain, unspecified: Secondary | ICD-10-CM | POA: Diagnosis not present

## 2023-05-04 DIAGNOSIS — B009 Herpesviral infection, unspecified: Secondary | ICD-10-CM | POA: Diagnosis not present

## 2023-05-04 DIAGNOSIS — K5909 Other constipation: Secondary | ICD-10-CM | POA: Diagnosis not present

## 2023-05-04 DIAGNOSIS — D649 Anemia, unspecified: Secondary | ICD-10-CM | POA: Diagnosis not present

## 2023-05-04 DIAGNOSIS — M810 Age-related osteoporosis without current pathological fracture: Secondary | ICD-10-CM | POA: Diagnosis not present

## 2023-05-04 DIAGNOSIS — E538 Deficiency of other specified B group vitamins: Secondary | ICD-10-CM | POA: Diagnosis not present

## 2023-05-04 DIAGNOSIS — Z0000 Encounter for general adult medical examination without abnormal findings: Secondary | ICD-10-CM | POA: Diagnosis not present

## 2023-05-04 DIAGNOSIS — L650 Telogen effluvium: Secondary | ICD-10-CM | POA: Diagnosis not present

## 2023-12-05 ENCOUNTER — Other Ambulatory Visit (HOSPITAL_COMMUNITY): Payer: Self-pay

## 2023-12-05 DIAGNOSIS — B009 Herpesviral infection, unspecified: Secondary | ICD-10-CM | POA: Diagnosis not present

## 2023-12-05 DIAGNOSIS — J029 Acute pharyngitis, unspecified: Secondary | ICD-10-CM | POA: Diagnosis not present

## 2023-12-05 DIAGNOSIS — E538 Deficiency of other specified B group vitamins: Secondary | ICD-10-CM | POA: Diagnosis not present

## 2023-12-05 DIAGNOSIS — R1310 Dysphagia, unspecified: Secondary | ICD-10-CM | POA: Diagnosis not present

## 2023-12-05 DIAGNOSIS — K121 Other forms of stomatitis: Secondary | ICD-10-CM | POA: Diagnosis not present

## 2023-12-05 MED ORDER — VALACYCLOVIR HCL 1 G PO TABS
1000.0000 mg | ORAL_TABLET | Freq: Every day | ORAL | 0 refills | Status: AC
  Filled 2023-12-05 (×2): qty 5, 5d supply, fill #0

## 2023-12-05 MED ORDER — NYSTATIN 100000 UNIT/ML MT SUSP
5.0000 mL | Freq: Four times a day (QID) | OROMUCOSAL | 2 refills | Status: DC | PRN
  Filled 2023-12-05: qty 400, 10d supply, fill #0

## 2023-12-05 MED ORDER — STERILE WATER FOR INJECTION IJ SOLN
OROMUCOSAL | 2 refills | Status: DC
  Filled 2023-12-05: qty 400, 10d supply, fill #0

## 2023-12-09 ENCOUNTER — Other Ambulatory Visit: Payer: Self-pay

## 2023-12-09 ENCOUNTER — Encounter (HOSPITAL_BASED_OUTPATIENT_CLINIC_OR_DEPARTMENT_OTHER): Payer: Self-pay | Admitting: Emergency Medicine

## 2023-12-09 ENCOUNTER — Emergency Department (HOSPITAL_BASED_OUTPATIENT_CLINIC_OR_DEPARTMENT_OTHER)
Admission: EM | Admit: 2023-12-09 | Discharge: 2023-12-09 | Disposition: A | Discharge status: Home / Self Care | Attending: Emergency Medicine | Admitting: Emergency Medicine

## 2023-12-09 DIAGNOSIS — K1230 Oral mucositis (ulcerative), unspecified: Secondary | ICD-10-CM | POA: Insufficient documentation

## 2023-12-09 DIAGNOSIS — K1379 Other lesions of oral mucosa: Secondary | ICD-10-CM | POA: Diagnosis present

## 2023-12-09 DIAGNOSIS — K121 Other forms of stomatitis: Secondary | ICD-10-CM

## 2023-12-09 MED ORDER — OXYCODONE HCL 5 MG PO TABS: 5.0000 mg | ORAL_TABLET | ORAL | 0 refills | Status: AC | PRN

## 2023-12-09 MED ORDER — LIDOCAINE VISCOUS HCL 2 % MT SOLN: 15.0000 mL | OROMUCOSAL | 0 refills | Status: AC | PRN

## 2023-12-09 NOTE — ED Triage Notes (Signed)
 Pt arrived POV, caox4, ambulatory c/o mouth lesions x2 wks. Pt states it started in the back of the throat and is now on her lips and is swollen in her cheeks. Pt has been taking valtrex and using magic mouth wash.

## 2023-12-09 NOTE — ED Notes (Signed)

## 2023-12-10 DIAGNOSIS — B370 Candidal stomatitis: Secondary | ICD-10-CM | POA: Diagnosis not present

## 2023-12-10 DIAGNOSIS — L0889 Other specified local infections of the skin and subcutaneous tissue: Secondary | ICD-10-CM | POA: Diagnosis not present

## 2023-12-10 DIAGNOSIS — R21 Rash and other nonspecific skin eruption: Secondary | ICD-10-CM | POA: Diagnosis not present

## 2023-12-10 DIAGNOSIS — S00522A Blister (nonthermal) of oral cavity, initial encounter: Secondary | ICD-10-CM | POA: Diagnosis not present

## 2023-12-10 NOTE — ED Provider Notes (Signed)
 [place] EMERGENCY DEPARTMENT AT [facility] Provider Note   CSN: [id number] Arrival date & time: 12/09/23  9293     Patient presents with: Mouth Lesions   Stephanie Morrow is a 68 y.o. female.   HPI     68yo female presents with concern for mouth lesions.  Seen at PCP office and started on valacyclovir 4 days ago and magic mouthwash.  Felt like it started 2 weeks ago in back of throat, now throat feels better but entire mouth has worsened with pain all around mouth, tongue, under tongue. Feels like face is swollen.   No other symptoms, no fever, no dental pain, no eye symptoms, no genital lesions, no specific joint pain. No new medications or exposures with exception of medications given for this.    Past Medical History:  Diagnosis Date   Allergy    OCCASIONALLY,NO MEDS   Osteopenia      Prior to Admission medications   Medication Sig Start Date End Date Taking? Authorizing Provider  lidocaine (XYLOCAINE) 2 % solution Use as directed 15 mLs in the mouth or throat every 3 (three) hours as needed for mouth pain (swish and spit). 12/09/23  Yes Dreama Longs, MD  oxyCODONE (ROXICODONE) 5 MG immediate release tablet Take 1 tablet (5 mg total) by mouth every 4 (four) hours as needed for severe pain (pain score 7-10). 12/09/23  Yes Dreama Longs, MD  Cyanocobalamin (VITAMIN B-12 SL) Place under the tongue daily. 5 ML    [provider]  Multiple Vitamin (MULTIVITAMIN) tablet Take 1 tablet by mouth daily.    [provider]  valACYclovir (VALTREX) 1000 MG tablet Take 1 tablet (1,000 mg total) by mouth daily for 5 days 12/05/23     VITAMIN D PO Take 5,000 Int'l Units by mouth. 5 times a week    [provider]    Allergies: Patient has no known allergies.    Review of Systems  Updated Vital Signs BP 124/72   Pulse 75   Temp 97.6 F (36.4 C) (Oral)   Resp 16   Ht 5' 3 (1.6 m)   Wt 61.2 kg   LMP 09/20/2010   SpO2 96%   BMI  23.91 kg/m   Physical Exam Vitals and nursing note reviewed.  Constitutional:      General: She is not in acute distress.    Appearance: Normal appearance. She is not ill-appearing, toxic-appearing or diaphoretic.  HENT:     Head: Normocephalic.     Mouth/Throat:     Comments: Ulcerations throughout buccal mucosa, under tongue, on tongue, at this point not involving pharynx, not on gingiva No sublingual swelling Swelling of lips and ulceration on mucosal surface of lips Slight lower facial swelling  Eyes:     Conjunctiva/sclera: Conjunctivae normal.  Cardiovascular:     Rate and Rhythm: Normal rate and regular rhythm.     Pulses: Normal pulses.  Pulmonary:     Effort: Pulmonary effort is normal. No respiratory distress.  Musculoskeletal:        General: No deformity or signs of injury.     Cervical back: No rigidity.  Skin:    General: Skin is warm and dry.     Coloration: Skin is not jaundiced or pale.  Neurological:     General: No focal deficit present.     Mental Status: She is alert and oriented to person, place, and time.     (all labs ordered are listed, but only abnormal  results are displayed) Labs Reviewed - No data to display  EKG: None  Radiology: No results found.   Procedures   Medications Ordered in the ED - No data to display                                  o female presents with concern for mouth lesions.  Do not see signs of bacterial infection, no signs of ludwig's, does not appear to have gingival involvement and doubt acute necrotizing ulcerative gingivitis.  Not on medications prior to this-not SJS.  Discussed ddx includes viral etiologies as well as autoimmune process but does not have other symptoms to suggest the autoimmunte disease.  At this time do not see clear indication for antifungal,steroid or abx.  As symptoms have worsened with magic mouthwash, will have stop this-will still give lidocaine for pain as well as rx for oxycodone  after review in [place] drug database. Patient discharged in stable condition with understanding of reasons to return.      Final diagnoses:  Oral ulceration    ED Discharge Orders          Ordered    lidocaine (XYLOCAINE) 2 % solution  Every  3 hours PRN        12/09/23 0815    oxyCODONE (ROXICODONE) 5 MG immediate release tablet  Every 4 hours PRN        12/09/23 9184               Dreama Longs, MD 12/10/23 1132
